# Patient Record
Sex: Male | Born: 1965 | Race: White | Hispanic: No | Marital: Married | Smoking: Current some day smoker
Health system: Southern US, Community
[De-identification: ages and names within clinical notes are randomized; demographics above are authoritative.]

## PROBLEM LIST (undated history)

## (undated) DIAGNOSIS — F32A Depression, unspecified: Secondary | ICD-10-CM

## (undated) DIAGNOSIS — F329 Major depressive disorder, single episode, unspecified: Secondary | ICD-10-CM

## (undated) DIAGNOSIS — E785 Hyperlipidemia, unspecified: Secondary | ICD-10-CM

## (undated) DIAGNOSIS — R7303 Prediabetes: Secondary | ICD-10-CM

## (undated) DIAGNOSIS — I2510 Atherosclerotic heart disease of native coronary artery without angina pectoris: Secondary | ICD-10-CM

## (undated) HISTORY — PX: BACK SURGERY: SHX140

## (undated) HISTORY — PX: NECK SURGERY: SHX720

## (undated) HISTORY — DX: Depression, unspecified: F32.A

---
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
## 2001-02-19 ENCOUNTER — Inpatient Hospital Stay (HOSPITAL_COMMUNITY): Admission: RE | Admit: 2001-02-19 | Discharge: 2001-02-20 | Payer: Self-pay | Admitting: Neurosurgery

## 2001-02-19 ENCOUNTER — Encounter: Payer: Self-pay | Admitting: Neurosurgery

## 2001-03-08 ENCOUNTER — Encounter: Admission: RE | Admit: 2001-03-08 | Discharge: 2001-03-08 | Payer: Self-pay | Admitting: Neurosurgery

## 2001-03-08 ENCOUNTER — Encounter: Payer: Self-pay | Admitting: Neurosurgery

## 2005-01-07 ENCOUNTER — Emergency Department (HOSPITAL_COMMUNITY): Admission: EM | Admit: 2005-01-07 | Discharge: 2005-01-07 | Payer: Self-pay | Admitting: Emergency Medicine

## 2006-04-12 ENCOUNTER — Emergency Department (HOSPITAL_COMMUNITY): Admission: EM | Admit: 2006-04-12 | Discharge: 2006-04-12 | Payer: Self-pay | Admitting: Emergency Medicine

## 2007-12-16 ENCOUNTER — Ambulatory Visit (HOSPITAL_COMMUNITY): Admission: RE | Admit: 2007-12-16 | Discharge: 2007-12-17 | Payer: Self-pay | Admitting: Orthopedic Surgery

## 2011-03-11 NOTE — Op Note (Signed)
Rodney Rangel, Rodney Rangel              ACCOUNT NO.:  [id number]   MEDICAL RECORD NO.:  [id number]          PATIENT TYPE:  AMB   LOCATION:  DAY                          FACILITY:  [facility]   PHYSICIAN:  Ronald A. Gioffre, M.D.DATE OF BIRTH:  30-Nov-1965   DATE OF PROCEDURE:  12/16/2007  DATE OF DISCHARGE:                               OPERATIVE REPORT   SURGEON:  Georges Lynch. Darrelyn Hillock, M.D.   ASSISTANT:  Marlowe Kays, M.D.   PREOPERATIVE DIAGNOSIS:  Extra large herniated lumbar disc at L5-S1 on  the left.  Note the patient had severe leg pain on the left, he  classified as an 11.   POSTOPERATIVE DIAGNOSIS:  Extra large herniated lumbar disc at L5-S1 on  the left.  Note the patient had severe leg pain on the left, he  classified as an 11.   OPERATION:  1. Hemilaminectomy at L5-S1, left.  2. Microdiscectomy at L5-S1, left.   PROCEDURE:  Under general anesthesia, the patient in a spinal frame,  routine orthopedic prep and drape of the lower back was carried out.  The patient had 1 gram of IV Ancef.  At this time, two needles were  placed in the back for localization purposes and an x-ray was taken.  Following that, an incision was made over the L5-S1 interspace.  Bleeders were identified and cauterized.  The self-retaining retractors  were inserted.  I separated the muscle first from the lamina and spinous  process in the usual fashion on the left.  I went down and put an  instrument in the space and took another x-ray to verify our position.  Following that, we did a hemilaminectomy in the usual fashion at L5-S1  on the left.  The microscope was brought in.  We carefully removed the  ligamentum flavum.  We decompressed the lateral recess because of the  nature of the disc that was so large.  We went down and first identified  the lateral recess veins and cauterized those with a bipolar.  Following  that, we noted the S1 root was extremely large and edematous.  We first  did a nice  foraminotomy to free up the root.  We gently retracted the  dura and went down and noted a large herniated lumbar disc under an  extreme amount of pressure.  We made a cruciate incision in the  posterior longitudinal ligament and the disc literally exploded out  through the posterior longitudinal ligament.  We then went down and  utilized a nerve hook and the Epstein curettes to clean out the rest of  the subligamentous disc material.  We went across the midline with the  upbiting pituitary rongeurs.  We then made sure the nerve was free and  the dura was free and it was. We made multiple passes into the disc  space, as well.  We were able to easily pass a hockey stick out the  foramina after the decompression and also we explored proximally and  medially as well and we now had complete freedom of the dura and the  root.  We thoroughly irrigated out the  area.  I loosely applied  some thrombin soaked Gelfoam and closed the wound in layers in the usual  fashion.  I left the small deep proximal part of the wound open for  drainage purposes.  The subcu was closed with 0 Vicryl and the skin with  metal staples and a sterile Neosporin dressing was applied.           ______________________________  Georges Lynch Darrelyn Hillock, M.D.     RAG/MEDQ  D:  12/16/2007  T:  12/17/2007  Job:  [id number]

## 2011-03-14 NOTE — H&P (Signed)
. [facility]  Patient:    Rodney Rangel, Rodney Rangel                     MRN: [id number] Adm. Date:  [id number] Disc. Date: [id number] Attending:  Danella Penton                         History and Physical  HISTORY OF PRESENT ILLNESS:  Mr. Siegmann is a gentleman who came to see me because neck and left upper extremity pain. This problem has been going on for more than 2 months, and he is not any better. He is having some numbness in the index and middle finger. The only way to get relief is to sleep in a recliner. He denies any problem with the right upper extremity or any problem with bladder or bowel.  PAST MEDICAL HISTORY:  Negative.  ALLERGIES:  CODEINE.  SOCIAL HISTORY:  He smokes a pack a day. He drinks socially.  FAMILY HISTORY:  Both parents are in good condition.  REVIEW OF SYSTEMS:  Only positive for arm pain, neck pain, and anxiety.  CURRENT MEDICATIONS:  He is taking Paxil and some pain medication.  PHYSICAL EXAMINATION:  GENERAL:  The patient came to my office. He was having quite a bit of difficulty with the left arm. He was holding the left arm against the chest wall to prevent any pain.  HEENT:  Normal.  NECK:  He is able to flex but ______ lateralization produces pain that goes to the left shoulder.  LUNGS:  Clear.  HEART:  Heart sounds normal.  ABDOMEN:  Normal.  EXTREMITIES:  Normal pulses.  NEUROLOGICAL:  Mental status normal. Cranial nerves normal. Strength is 5/5, except in the left tricep which is 1/5. There is a difference of an inch between the right and left the arm. He has a good thenar muscle but in the left hand that is weak in the hypothenar. Reflexes 2+ with absent in the left tricep.  LABORATORY DATA:  MRI showed that indeed he has a large herniated disk at the level of C6-C7, central to the left.  CLINICAL IMPRESSION:  C6-C7 herniated disk.  RECOMMENDATIONS:  The patient wants to proceed with  surgery. He knows all the risks, such as infection, CSF leak, worsening of pain, paralysis, and need for further surgery.DD:  02/19/01 TD:  02/21/01 Job: [id number]

## 2011-03-14 NOTE — Op Note (Signed)
[place]  Patient:    Rodney Rangel, Rodney Rangel                     MRN: [id number] Proc. Date: 02/19/01 Adm. Date:  [id number] Disc. Date: [id number] Attending:  Danella Penton                           Operative Report  PREOPERATIVE DIAGNOSIS:  C6-C7 herniated disk with chronic C7 radiculopathy.  POSTOPERATIVE DIAGNOSIS:  C3-C4 herniated disk with chronic C7 radiculopathy.  OPERATION:  Anterior C6-7 diskectomy, foraminotomy, decompression of the C6-7 nerve root, bone bank iliac crest and plate.  MICROSCOPE:  Midas Rex.  SURGEON:  Tanya Nones. Jeral Fruit, M.D.  ASSISTANTMena Goes. Franky Macho, M.D.  CLINICAL HISTORY: The patient is a 45 year old gentleman who had been complaining of neck and left upper extremity pain for several months.  Lately the pain went away, but by the time I saw him in by office, he had 0/5 at the triceps.  The patient did not want to go ahead with surgery.  He continued to work.  I saw him a few days ago in my office after he came in 72 hours later because the pain came back and was so intense that he wanted to have surgery as soon as possible.  This is the reason why I brought him in this evening to have surgery.  The patient knew of the risks such as explained in the History & Physical.  DESCRIPTION OF PROCEDURE:  The patient was taken to the OR, and the left side of the neck was prepped with Betadine.  Transverse incision through the skin and platysma down.  X-ray showed indeed we were at the level of C6-7.  He had anterior osteophyte which was removed.  We opened the anterior ligament, and we did a total gross diskectomy.  We brought the microscope in to the area. Indeed, on the left side, he has a soft and hard herniated disk with several fragments.  Some of the fragments were completely attached to the C6-7 level. The compression was achieved using the Kerrison punch as well as the drill. At the end, we had good  decompression of the C-7 nerve root bilaterally.  Then a piece of bone graft several millimeters high was inserted at this level followed by a plate with four screws.  X-ray of C spine showed good position of the graft.  From there on, the area was irrigated.  Hemostasis was done with bipolar, and the wound was closed with Vicryl and Steri-Strips.  The patient did well. DD:  02/19/01 TD:  02/21/01 Job: [id number]

## 2011-07-18 LAB — DIFFERENTIAL
Eosinophils Relative: 2
Lymphocytes Relative: 42
Lymphs Abs: 3.2
Monocytes Absolute: 0.4

## 2011-07-18 LAB — COMPREHENSIVE METABOLIC PANEL
AST: 12
Albumin: 3.5
Calcium: 9.3
Chloride: 107
Creatinine, Ser: 0.88
GFR calc Af Amer: >60
Total Bilirubin: 0.5

## 2011-07-18 LAB — URINALYSIS, ROUTINE W REFLEX MICROSCOPIC
Hgb urine dipstick: NEGATIVE
Nitrite: NEGATIVE
Protein, ur: NEGATIVE
Urobilinogen, UA: 1

## 2011-07-18 LAB — URINE CULTURE
Colony Count: NO GROWTH
Culture: NO GROWTH

## 2011-07-18 LAB — CBC
MCV: 90.2
Platelets: 212
WBC: 7.7

## 2015-06-18 ENCOUNTER — Emergency Department (HOSPITAL_COMMUNITY): Payer: No Typology Code available for payment source

## 2015-06-18 ENCOUNTER — Emergency Department (HOSPITAL_COMMUNITY)
Admission: EM | Admit: 2015-06-18 | Discharge: 2015-06-18 | Disposition: A | Discharge status: Home / Self Care | Payer: No Typology Code available for payment source | Attending: Physician Assistant | Admitting: Physician Assistant

## 2015-06-18 ENCOUNTER — Encounter (HOSPITAL_COMMUNITY): Payer: Self-pay | Admitting: Emergency Medicine

## 2015-06-18 DIAGNOSIS — Y998 Other external cause status: Secondary | ICD-10-CM | POA: Insufficient documentation

## 2015-06-18 DIAGNOSIS — Y9389 Activity, other specified: Secondary | ICD-10-CM | POA: Insufficient documentation

## 2015-06-18 DIAGNOSIS — S3992XA Unspecified injury of lower back, initial encounter: Secondary | ICD-10-CM | POA: Insufficient documentation

## 2015-06-18 DIAGNOSIS — Y92410 Unspecified street and highway as the place of occurrence of the external cause: Secondary | ICD-10-CM | POA: Diagnosis not present

## 2015-06-18 DIAGNOSIS — Z9889 Other specified postprocedural states: Secondary | ICD-10-CM | POA: Diagnosis not present

## 2015-06-18 DIAGNOSIS — Z720 Tobacco use: Secondary | ICD-10-CM | POA: Diagnosis not present

## 2015-06-18 DIAGNOSIS — M549 Dorsalgia, unspecified: Secondary | ICD-10-CM

## 2015-06-18 MED ORDER — OXYCODONE-ACETAMINOPHEN 5-325 MG PO TABS
2.0000 | ORAL_TABLET | Freq: Once | ORAL | Status: AC
  Administered 2015-06-18: 2 via ORAL
  Filled 2015-06-18: qty 2

## 2015-06-18 MED ORDER — OXYCODONE-ACETAMINOPHEN 5-325 MG PO TABS: 1.0000 | ORAL_TABLET | ORAL | Status: DC | PRN

## 2015-06-18 MED ORDER — METHOCARBAMOL 500 MG PO TABS: 500.0000 mg | ORAL_TABLET | Freq: Two times a day (BID) | ORAL | Status: DC

## 2015-06-18 NOTE — ED Notes (Signed)
Pt is a&ox4, and ambulatory. Questions denied r/t dc
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
## 2015-06-18 NOTE — Discharge Instructions (Signed)
Take the prescribed medication as directed to help with pain. Follow-up with your primary care physician. Return to the ED for new or worsening symptoms.
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
## 2016-11-05 DIAGNOSIS — Z720 Tobacco use: Secondary | ICD-10-CM | POA: Diagnosis not present

## 2016-11-14 DIAGNOSIS — E785 Hyperlipidemia, unspecified: Secondary | ICD-10-CM | POA: Diagnosis not present

## 2016-11-14 DIAGNOSIS — R7301 Impaired fasting glucose: Secondary | ICD-10-CM | POA: Diagnosis not present

## 2016-12-31 DIAGNOSIS — E785 Hyperlipidemia, unspecified: Secondary | ICD-10-CM | POA: Diagnosis not present

## 2017-05-26 DIAGNOSIS — L299 Pruritus, unspecified: Secondary | ICD-10-CM | POA: Diagnosis not present

## 2017-06-09 DIAGNOSIS — E785 Hyperlipidemia, unspecified: Secondary | ICD-10-CM | POA: Diagnosis not present

## 2017-10-27 HISTORY — PX: COLONOSCOPY: SHX174

## 2017-11-17 DIAGNOSIS — R7303 Prediabetes: Secondary | ICD-10-CM | POA: Diagnosis not present

## 2017-11-17 DIAGNOSIS — E785 Hyperlipidemia, unspecified: Secondary | ICD-10-CM | POA: Diagnosis not present

## 2018-01-06 ENCOUNTER — Emergency Department (HOSPITAL_COMMUNITY): Payer: 59

## 2018-01-06 ENCOUNTER — Other Ambulatory Visit: Payer: Self-pay

## 2018-01-06 ENCOUNTER — Encounter (HOSPITAL_COMMUNITY): Payer: Self-pay | Admitting: Emergency Medicine

## 2018-01-06 DIAGNOSIS — E669 Obesity, unspecified: Secondary | ICD-10-CM | POA: Diagnosis not present

## 2018-01-06 DIAGNOSIS — R7303 Prediabetes: Secondary | ICD-10-CM | POA: Diagnosis present

## 2018-01-06 DIAGNOSIS — Z8249 Family history of ischemic heart disease and other diseases of the circulatory system: Secondary | ICD-10-CM

## 2018-01-06 DIAGNOSIS — I25110 Atherosclerotic heart disease of native coronary artery with unstable angina pectoris: Secondary | ICD-10-CM | POA: Diagnosis not present

## 2018-01-06 DIAGNOSIS — R001 Bradycardia, unspecified: Secondary | ICD-10-CM | POA: Diagnosis not present

## 2018-01-06 DIAGNOSIS — F17290 Nicotine dependence, other tobacco product, uncomplicated: Secondary | ICD-10-CM | POA: Diagnosis present

## 2018-01-06 DIAGNOSIS — E785 Hyperlipidemia, unspecified: Secondary | ICD-10-CM | POA: Diagnosis present

## 2018-01-06 DIAGNOSIS — R079 Chest pain, unspecified: Secondary | ICD-10-CM | POA: Diagnosis not present

## 2018-01-06 DIAGNOSIS — Z6830 Body mass index (BMI) 30.0-30.9, adult: Secondary | ICD-10-CM

## 2018-01-06 DIAGNOSIS — R072 Precordial pain: Secondary | ICD-10-CM | POA: Diagnosis not present

## 2018-01-06 LAB — BASIC METABOLIC PANEL
ANION GAP: 11 (ref 5–15)
BUN: 16 mg/dL (ref 6–20)
CHLORIDE: 105 mmol/L (ref 101–111)
CO2: 26 mmol/L (ref 22–32)
Calcium: 9.6 mg/dL (ref 8.9–10.3)
Creatinine, Ser: 0.96 mg/dL (ref 0.61–1.24)
GFR calc Af Amer: >60 mL/min (ref >60)
GFR calc non Af Amer: >60 mL/min (ref >60)
GLUCOSE: 107 mg/dL — AB (ref 65–99)
POTASSIUM: 4.3 mmol/L (ref 3.5–5.1)
Sodium: 142 mmol/L (ref 135–145)

## 2018-01-06 LAB — CBC
HEMATOCRIT: 46.2 % (ref 39.0–52.0)
HEMOGLOBIN: 14.9 g/dL (ref 13.0–17.0)
MCH: 30.3 pg (ref 26.0–34.0)
MCHC: 32.3 g/dL (ref 30.0–36.0)
MCV: 94.1 fL (ref 78.0–100.0)
Platelets: 219 10*3/uL (ref 150–400)
RBC: 4.91 MIL/uL (ref 4.22–5.81)
RDW: 13.3 % (ref 11.5–15.5)
WBC: 9.2 10*3/uL (ref 4.0–10.5)

## 2018-01-06 LAB — I-STAT TROPONIN, ED: Troponin i, poc: 0 ng/mL (ref 0.00–0.08)

## 2018-01-06 NOTE — ED Triage Notes (Signed)
Pt complaint of mid chest pain about 2 hours ago; "white as a ghost" at time of event. Denies other.

## 2018-01-07 ENCOUNTER — Encounter (HOSPITAL_COMMUNITY)
Admission: EM | Disposition: A | Discharge status: Home / Self Care | Payer: Self-pay | Source: Home / Self Care | Attending: Emergency Medicine

## 2018-01-07 ENCOUNTER — Other Ambulatory Visit: Payer: Self-pay

## 2018-01-07 ENCOUNTER — Other Ambulatory Visit (HOSPITAL_COMMUNITY): Payer: 59

## 2018-01-07 ENCOUNTER — Observation Stay (HOSPITAL_COMMUNITY)
Admission: EM | Admit: 2018-01-07 | Discharge: 2018-01-08 | DRG: 247 | Disposition: A | Discharge status: Home / Self Care | Payer: 59 | Attending: Cardiology | Admitting: Cardiology

## 2018-01-07 ENCOUNTER — Encounter (HOSPITAL_COMMUNITY): Payer: Self-pay | Admitting: Internal Medicine

## 2018-01-07 DIAGNOSIS — R079 Chest pain, unspecified: Secondary | ICD-10-CM

## 2018-01-07 DIAGNOSIS — E669 Obesity, unspecified: Secondary | ICD-10-CM | POA: Diagnosis not present

## 2018-01-07 DIAGNOSIS — Z955 Presence of coronary angioplasty implant and graft: Secondary | ICD-10-CM

## 2018-01-07 DIAGNOSIS — R001 Bradycardia, unspecified: Secondary | ICD-10-CM | POA: Diagnosis not present

## 2018-01-07 DIAGNOSIS — I200 Unstable angina: Secondary | ICD-10-CM | POA: Diagnosis not present

## 2018-01-07 DIAGNOSIS — Z6830 Body mass index (BMI) 30.0-30.9, adult: Secondary | ICD-10-CM | POA: Diagnosis not present

## 2018-01-07 DIAGNOSIS — I25110 Atherosclerotic heart disease of native coronary artery with unstable angina pectoris: Secondary | ICD-10-CM

## 2018-01-07 DIAGNOSIS — E785 Hyperlipidemia, unspecified: Secondary | ICD-10-CM | POA: Diagnosis not present

## 2018-01-07 DIAGNOSIS — F17290 Nicotine dependence, other tobacco product, uncomplicated: Secondary | ICD-10-CM | POA: Diagnosis not present

## 2018-01-07 DIAGNOSIS — Z8249 Family history of ischemic heart disease and other diseases of the circulatory system: Secondary | ICD-10-CM | POA: Diagnosis not present

## 2018-01-07 DIAGNOSIS — R7303 Prediabetes: Secondary | ICD-10-CM | POA: Diagnosis not present

## 2018-01-07 DIAGNOSIS — R072 Precordial pain: Secondary | ICD-10-CM | POA: Diagnosis not present

## 2018-01-07 HISTORY — DX: Hyperlipidemia, unspecified: E78.5

## 2018-01-07 HISTORY — PX: LEFT HEART CATH AND CORONARY ANGIOGRAPHY: CATH118249

## 2018-01-07 HISTORY — PX: CORONARY STENT INTERVENTION: CATH118234

## 2018-01-07 HISTORY — DX: Prediabetes: R73.03

## 2018-01-07 HISTORY — PX: CARDIAC CATHETERIZATION: SHX172

## 2018-01-07 HISTORY — PX: CORONARY STENT PLACEMENT: SHX1402

## 2018-01-07 HISTORY — DX: Atherosclerotic heart disease of native coronary artery without angina pectoris: I25.10

## 2018-01-07 LAB — BRAIN NATRIURETIC PEPTIDE: B Natriuretic Peptide: 10.4 pg/mL (ref 0.0–100.0)

## 2018-01-07 LAB — LIPID PANEL
CHOLESTEROL: 148 mg/dL (ref 0–200)
HDL: 30 mg/dL — ABNORMAL LOW (ref >40)
LDL Cholesterol: 86 mg/dL (ref 0–99)
Total CHOL/HDL Ratio: 4.9 RATIO
Triglycerides: 162 mg/dL — ABNORMAL HIGH (ref <150)
VLDL: 32 mg/dL (ref 0–40)

## 2018-01-07 LAB — HEPATIC FUNCTION PANEL
ALK PHOS: 97 U/L (ref 38–126)
ALT: 22 U/L (ref 17–63)
AST: 19 U/L (ref 15–41)
Albumin: 3.8 g/dL (ref 3.5–5.0)
BILIRUBIN DIRECT: 0.1 mg/dL (ref 0.1–0.5)
BILIRUBIN TOTAL: 0.3 mg/dL (ref 0.3–1.2)
Indirect Bilirubin: 0.2 mg/dL — ABNORMAL LOW (ref 0.3–0.9)
Total Protein: 6.5 g/dL (ref 6.5–8.1)

## 2018-01-07 LAB — CREATININE, SERUM: CREATININE: 1.07 mg/dL (ref 0.61–1.24)

## 2018-01-07 LAB — PROTIME-INR
INR: 1.05
Prothrombin Time: 13.6 seconds (ref 11.4–15.2)

## 2018-01-07 LAB — HIV ANTIBODY (ROUTINE TESTING W REFLEX): HIV Screen 4th Generation wRfx: NONREACTIVE

## 2018-01-07 LAB — CBC
HCT: 42.2 % (ref 39.0–52.0)
HEMOGLOBIN: 13.7 g/dL (ref 13.0–17.0)
MCH: 30.8 pg (ref 26.0–34.0)
MCHC: 32.5 g/dL (ref 30.0–36.0)
MCV: 94.8 fL (ref 78.0–100.0)
Platelets: 198 10*3/uL (ref 150–400)
RBC: 4.45 MIL/uL (ref 4.22–5.81)
RDW: 13.7 % (ref 11.5–15.5)
WBC: 9.1 10*3/uL (ref 4.0–10.5)

## 2018-01-07 LAB — HEMOGLOBIN A1C
HEMOGLOBIN A1C: 6.2 % — AB (ref 4.8–5.6)
MEAN PLASMA GLUCOSE: 131.24 mg/dL

## 2018-01-07 LAB — POCT ACTIVATED CLOTTING TIME: Activated Clotting Time: 285 seconds

## 2018-01-07 LAB — TROPONIN I

## 2018-01-07 LAB — D-DIMER, QUANTITATIVE: D-Dimer, Quant: 0.27 ug/mL-FEU (ref 0.00–0.50)

## 2018-01-07 SURGERY — LEFT HEART CATH AND CORONARY ANGIOGRAPHY
Anesthesia: LOCAL

## 2018-01-07 MED ORDER — ROSUVASTATIN CALCIUM 20 MG PO TABS
40.0000 mg | ORAL_TABLET | Freq: Every day | ORAL | Status: DC
  Administered 2018-01-07 – 2018-01-08 (×2): 40 mg via ORAL
  Filled 2018-01-07 (×2): qty 2
  Filled 2018-01-07: qty 1

## 2018-01-07 MED ORDER — ONDANSETRON HCL 4 MG/2ML IJ SOLN
4.0000 mg | Freq: Four times a day (QID) | INTRAMUSCULAR | Status: DC | PRN
  Administered 2018-01-07: 17:00:00 4 mg via INTRAVENOUS
  Filled 2018-01-07: qty 2

## 2018-01-07 MED ORDER — MIDAZOLAM HCL 2 MG/2ML IJ SOLN
INTRAMUSCULAR | Status: DC | PRN
  Administered 2018-01-07: 2 mg via INTRAVENOUS

## 2018-01-07 MED ORDER — FENTANYL CITRATE (PF) 100 MCG/2ML IJ SOLN
INTRAMUSCULAR | Status: AC
  Filled 2018-01-07: qty 2

## 2018-01-07 MED ORDER — LIDOCAINE HCL (PF) 1 % IJ SOLN
INTRAMUSCULAR | Status: AC
  Filled 2018-01-07: qty 30

## 2018-01-07 MED ORDER — TICAGRELOR 90 MG PO TABS
90.0000 mg | ORAL_TABLET | Freq: Two times a day (BID) | ORAL | Status: DC
  Administered 2018-01-08 (×2): 90 mg via ORAL
  Filled 2018-01-07 (×2): qty 1

## 2018-01-07 MED ORDER — HYDROCORTISONE 1 % EX CREA
TOPICAL_CREAM | Freq: Every day | CUTANEOUS | Status: DC | PRN
  Administered 2018-01-07: 22:00:00 via TOPICAL
  Filled 2018-01-07: qty 28

## 2018-01-07 MED ORDER — ASPIRIN EC 325 MG PO TBEC
325.0000 mg | DELAYED_RELEASE_TABLET | Freq: Every day | ORAL | Status: DC
  Filled 2018-01-07: qty 1

## 2018-01-07 MED ORDER — NITROGLYCERIN 1 MG/10 ML FOR IR/CATH LAB
INTRA_ARTERIAL | Status: DC | PRN
  Administered 2018-01-07: 200 ug via INTRACORONARY

## 2018-01-07 MED ORDER — SODIUM CHLORIDE 0.9 % WEIGHT BASED INFUSION
1.0000 mL/kg/h | INTRAVENOUS | Status: AC
  Administered 2018-01-07: 20:00:00 1 mL/kg/h via INTRAVENOUS

## 2018-01-07 MED ORDER — LIDOCAINE HCL (PF) 1 % IJ SOLN
INTRAMUSCULAR | Status: DC | PRN
  Administered 2018-01-07: 2 mL

## 2018-01-07 MED ORDER — ACETAMINOPHEN 325 MG PO TABS: 650.0000 mg | ORAL_TABLET | ORAL | Status: DC | PRN

## 2018-01-07 MED ORDER — HEPARIN SODIUM (PORCINE) 1000 UNIT/ML IJ SOLN
INTRAMUSCULAR | Status: AC
  Filled 2018-01-07: qty 1

## 2018-01-07 MED ORDER — HEPARIN (PORCINE) IN NACL 2-0.9 UNIT/ML-% IJ SOLN
INTRAMUSCULAR | Status: AC | PRN
  Administered 2018-01-07: 500 mL

## 2018-01-07 MED ORDER — VERAPAMIL HCL 2.5 MG/ML IV SOLN
INTRAVENOUS | Status: AC
  Filled 2018-01-07: qty 2

## 2018-01-07 MED ORDER — VERAPAMIL HCL 2.5 MG/ML IV SOLN
INTRAVENOUS | Status: DC | PRN
  Administered 2018-01-07: 14:00:00 via INTRA_ARTERIAL

## 2018-01-07 MED ORDER — HEART ATTACK BOUNCING BOOK
Freq: Once | Status: AC
  Administered 2018-01-08: 02:00:00
  Filled 2018-01-07: qty 1

## 2018-01-07 MED ORDER — MIDAZOLAM HCL 2 MG/2ML IJ SOLN
INTRAMUSCULAR | Status: AC
  Filled 2018-01-07: qty 2

## 2018-01-07 MED ORDER — ASPIRIN 81 MG PO CHEW
324.0000 mg | CHEWABLE_TABLET | Freq: Once | ORAL | Status: AC
  Administered 2018-01-07: 324 mg via ORAL
  Filled 2018-01-07: qty 4

## 2018-01-07 MED ORDER — IOPAMIDOL (ISOVUE-370) INJECTION 76%
INTRAVENOUS | Status: DC | PRN
  Administered 2018-01-07: 130 mL via INTRA_ARTERIAL

## 2018-01-07 MED ORDER — SODIUM CHLORIDE 0.9% FLUSH
3.0000 mL | Freq: Two times a day (BID) | INTRAVENOUS | Status: DC
  Administered 2018-01-08: 3 mL via INTRAVENOUS

## 2018-01-07 MED ORDER — SODIUM CHLORIDE 0.9% FLUSH: 3.0000 mL | INTRAVENOUS | Status: DC | PRN

## 2018-01-07 MED ORDER — ANGIOPLASTY BOOK
Freq: Once | Status: AC
  Administered 2018-01-08: 02:00:00
  Filled 2018-01-07: qty 1

## 2018-01-07 MED ORDER — TICAGRELOR 90 MG PO TABS
ORAL_TABLET | ORAL | Status: DC | PRN
  Administered 2018-01-07: 180 mg via ORAL

## 2018-01-07 MED ORDER — ASPIRIN 81 MG PO CHEW
81.0000 mg | CHEWABLE_TABLET | Freq: Every day | ORAL | Status: DC
  Administered 2018-01-08: 81 mg via ORAL
  Filled 2018-01-07: qty 1

## 2018-01-07 MED ORDER — IOPAMIDOL (ISOVUE-370) INJECTION 76%
INTRAVENOUS | Status: AC
  Filled 2018-01-07: qty 50

## 2018-01-07 MED ORDER — HEPARIN SODIUM (PORCINE) 1000 UNIT/ML IJ SOLN
INTRAMUSCULAR | Status: DC | PRN
  Administered 2018-01-07 (×2): 5000 [IU] via INTRAVENOUS

## 2018-01-07 MED ORDER — FENTANYL CITRATE (PF) 100 MCG/2ML IJ SOLN
INTRAMUSCULAR | Status: DC | PRN
  Administered 2018-01-07: 25 ug via INTRAVENOUS

## 2018-01-07 MED ORDER — ENOXAPARIN SODIUM 40 MG/0.4ML ~~LOC~~ SOLN
40.0000 mg | SUBCUTANEOUS | Status: DC
  Filled 2018-01-07: qty 0.4

## 2018-01-07 MED ORDER — PAROXETINE HCL 20 MG PO TABS
40.0000 mg | ORAL_TABLET | Freq: Every day | ORAL | Status: DC
  Administered 2018-01-07 – 2018-01-08 (×2): 40 mg via ORAL
  Filled 2018-01-07 (×3): qty 2

## 2018-01-07 MED ORDER — TICAGRELOR 90 MG PO TABS
ORAL_TABLET | ORAL | Status: AC
  Filled 2018-01-07: qty 2

## 2018-01-07 MED ORDER — HEPARIN (PORCINE) IN NACL 2-0.9 UNIT/ML-% IJ SOLN
INTRAMUSCULAR | Status: AC
  Filled 2018-01-07: qty 1000

## 2018-01-07 MED ORDER — SODIUM CHLORIDE 0.9 % IV SOLN: 250.0000 mL | INTRAVENOUS | Status: DC | PRN

## 2018-01-07 MED ORDER — IOPAMIDOL (ISOVUE-370) INJECTION 76%
INTRAVENOUS | Status: AC
  Filled 2018-01-07: qty 100

## 2018-01-07 MED ORDER — GABAPENTIN 400 MG PO CAPS
800.0000 mg | ORAL_CAPSULE | Freq: Every day | ORAL | Status: DC
  Administered 2018-01-07 – 2018-01-08 (×2): 800 mg via ORAL
  Filled 2018-01-07 (×2): qty 2

## 2018-01-07 SURGICAL SUPPLY — 20 items
BALLN SAPPHIRE 2.5X12 (BALLOONS) ×2
BALLN SAPPHIRE ~~LOC~~ 4.0X15 (BALLOONS) ×2 IMPLANT
BALLOON SAPPHIRE 2.5X12 (BALLOONS) ×1 IMPLANT
BAND ZEPHYR COMPRESS 30 LONG (HEMOSTASIS) ×2 IMPLANT
CATH INFINITI 5 FR JL3.5 (CATHETERS) ×2 IMPLANT
CATH INFINITI 5FR ANG PIGTAIL (CATHETERS) ×2 IMPLANT
CATH INFINITI JR4 5F (CATHETERS) ×2 IMPLANT
CATH VISTA GUIDE 6FR MPA1 (CATHETERS) ×2 IMPLANT
GUIDEWIRE INQWIRE 1.5J.035X260 (WIRE) ×1 IMPLANT
INQWIRE 1.5J .035X260CM (WIRE) ×2
KIT ENCORE 26 ADVANTAGE (KITS) ×2 IMPLANT
KIT HEART LEFT (KITS) ×2 IMPLANT
NEEDLE PERC 21GX4CM (NEEDLE) ×2 IMPLANT
PACK CARDIAC CATHETERIZATION (CUSTOM PROCEDURE TRAY) ×2 IMPLANT
SHEATH RAIN RADIAL 21G 6FR (SHEATH) ×2 IMPLANT
STENT SYNERGY DES 3.5X16 (Permanent Stent) ×2 IMPLANT
STENT SYNERGY DES 3.5X8 (Permanent Stent) ×2 IMPLANT
TRANSDUCER W/STOPCOCK (MISCELLANEOUS) ×2 IMPLANT
TUBING CIL FLEX 10 FLL-RA (TUBING) ×2 IMPLANT
WIRE ASAHI PROWATER 180CM (WIRE) ×2 IMPLANT

## 2018-01-07 NOTE — ED Notes (Signed)
[facility] has arrived for pt transport to Cath Lab
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
## 2018-01-07 NOTE — H&P (Signed)
History and Physical    Rodney Rangel UJW:[id number] DOB: 1966-06-15 DOA: 01/07/2018  PCP: Deatra James, MD  Patient coming from: Home.  Chief Complaint: Chest pain.  HPI: Rodney Rangel is a 52 y.o. male with history of tobacco abuse and hyperlipidemia presents to the ER because of chest pain.  Patient states last afternoon around 2 PM while walking patient started developing squeezing type of chest pressure with some shortness of breath and diaphoresis.  Increase on exertion.  Since the discomfort is not getting better patient talked to the nurse at the facility.  Eventually patient came to the ER with his family.  By the time patient reached ER chest pain is resolved.  Patient states he has been having chest pain off and on for last 5 weeks.  But has not been so intense.  Denies any nausea vomiting abdominal pain fever chills or productive cough.  ED Course: In the ER EKG shows nonspecific changes in the inferior leads which changed on repeat EKG.  Troponin was negative.  Chest x-ray unremarkable.  Given the exertional symptoms and family history of CAD in his dad and patient's history of tobacco abuse patient admitted for further management of ACS rule out.  Review of Systems: As per HPI, rest all negative.   Past Medical History:  Diagnosis Date  . Hyperlipidemia     Past Surgical History:  Procedure Laterality Date  . BACK SURGERY    . NECK SURGERY       reports that he has been smoking.  he has never used smokeless tobacco. He reports that he does not drink alcohol. His drug history is not on file.  No Known Allergies  Family History - Father had CAD and Lung cancer.  Prior to Admission medications   Medication Sig Start Date End Date Taking? Authorizing Provider  gabapentin (NEURONTIN) 800 MG tablet Take 800 mg by mouth daily. 11/20/17  Yes [provider]  PARoxetine (PAXIL) 40 MG tablet Take 40 mg by mouth daily. 11/20/17  Yes [provider]    rosuvastatin (CRESTOR) 40 MG tablet Take 40 mg by mouth daily.   Yes [provider]  methocarbamol (ROBAXIN) 500 MG tablet Take 1 tablet (500 mg total) by mouth 2 (two) times daily. Patient not taking: Reported on 01/07/2018 06/18/15   Garlon Hatchet, PA-C  oxyCODONE-acetaminophen (PERCOCET/ROXICET) 5-325 MG per tablet Take 1 tablet by mouth every 4 (four) hours as needed. Patient not taking: Reported on 01/07/2018 06/18/15   Garlon Hatchet, PA-C    Physical Exam: Vitals:   01/06/18 2004 01/06/18 2221 01/07/18 0249 01/07/18 0500  BP: (!) 151/81 136/85 114/79 102/64  Pulse: 84 86 65 69  Resp: 16 16 16 16   Temp: 98.3 F (36.8 C) 98.2 F (36.8 C)    TempSrc: Oral Oral    SpO2: 98% 100% 96% 92%      Constitutional: Moderately built and nourished. Vitals:   01/06/18 2004 01/06/18 2221 01/07/18 0249 01/07/18 0500  BP: (!) 151/81 136/85 114/79 102/64  Pulse: 84 86 65 69  Resp: 16 16 16 16   Temp: 98.3 F (36.8 C) 98.2 F (36.8 C)    TempSrc: Oral Oral    SpO2: 98% 100% 96% 92%   Eyes: Anicteric no pallor. ENMT: No discharge from the ears eyes nose or mouth. Neck: No mass felt.  No JVD appreciated. Respiratory: No rhonchi or crepitations. Cardiovascular: S1-S2 heard no murmurs appreciated. Abdomen: Soft nontender bowel sounds present. Musculoskeletal:  No edema.  No joint effusion. Skin: No rash.  Skin appears warm. Neurologic: Alert awake oriented to time place and person.  Moves all extremities. Psychiatric: Appears normal.  Normal affect.   Labs on Admission: I have personally reviewed following labs and imaging studies  CBC: Recent Labs  Lab 01/06/18 1727  WBC 9.2  HGB 14.9  HCT 46.2  MCV 94.1  PLT 219   Basic Metabolic Panel: Recent Labs  Lab 01/06/18 1727  NA 142  K 4.3  CL 105  CO2 26  GLUCOSE 107*  BUN 16  CREATININE 0.96  CALCIUM 9.6   GFR: CrCl cannot be calculated (Unknown ideal weight.). Liver Function Tests: No results for  input(s): AST, ALT, ALKPHOS, BILITOT, PROT, ALBUMIN in the last 168 hours. No results for input(s): LIPASE, AMYLASE in the last 168 hours. No results for input(s): AMMONIA in the last 168 hours. Coagulation Profile: No results for input(s): INR, PROTIME in the last 168 hours. Cardiac Enzymes: Recent Labs  Lab 01/07/18 0214  TROPONINI <0.03   BNP (last 3 results) No results for input(s): PROBNP in the last 8760 hours. HbA1C: No results for input(s): HGBA1C in the last 72 hours. CBG: No results for input(s): GLUCAP in the last 168 hours. Lipid Profile: No results for input(s): CHOL, HDL, LDLCALC, TRIG, CHOLHDL, LDLDIRECT in the last 72 hours. Thyroid Function Tests: No results for input(s): TSH, T4TOTAL, FREET4, T3FREE, THYROIDAB in the last 72 hours. Anemia Panel: No results for input(s): VITAMINB12, FOLATE, FERRITIN, TIBC, IRON, RETICCTPCT in the last 72 hours. Urine analysis:    Component Value Date/Time   COLORURINE AMBER BIOCHEMICALS MAY BE AFFECTED BY COLOR (A) 12/10/2007 1404   APPEARANCEUR CLEAR 12/10/2007 1404   LABSPEC 1.038 (H) 12/10/2007 1404   PHURINE 5.0 12/10/2007 1404   GLUCOSEU NEGATIVE 12/10/2007 1404   HGBUR NEGATIVE 12/10/2007 1404   BILIRUBINUR SMALL (A) 12/10/2007 1404   KETONESUR NEGATIVE 12/10/2007 1404   PROTEINUR NEGATIVE 12/10/2007 1404   UROBILINOGEN 1.0 12/10/2007 1404   NITRITE NEGATIVE 12/10/2007 1404   LEUKOCYTESUR  12/10/2007 1404    NEGATIVE MICROSCOPIC NOT DONE ON URINES WITH NEGATIVE PROTEIN, BLOOD, LEUKOCYTES, NITRITE, OR GLUCOSE <1000 mg/dL.   Sepsis Labs: @LABRCNTIP (procalcitonin:4,lacticidven:4) )No results found for this or any previous visit (from the past 240 hour(s)).   Radiological Exams on Admission: Dg Chest 2 View  Result Date: 01/06/2018 CLINICAL DATA:  Dizziness, chest pain EXAM: CHEST - 2 VIEW COMPARISON:  12/10/2007 FINDINGS: Heart and mediastinal contours are within normal limits. No focal opacities or effusions. No  acute bony abnormality. IMPRESSION: No active cardiopulmonary disease. Electronically Signed   By: Charlett NoseKevin  Dover M.D.   On: 01/06/2018 18:21    EKG: Independently reviewed.  Normal sinus rhythm with inferior T wave changes which has changed in the retrograde EKG.  Assessment/Plan Principal Problem:   Chest pain Active Problems:   Hyperlipidemia    1. Chest pain -presently chest pain-free.  Given the history of tobacco abuse hyperlipidemia and family history we will cycle cardiac markers to rule out ACS.  Check 2D echo.  Check urine drug screen d-dimer and I have requested cardiology consult.  Aspirin. 2. Hyperlipidemia on statins. 3. History of tobacco abuse -advised to quit smoking.    DVT prophylaxis: Lovenox. Code Status: Full code. Family Communication: Patient's family at the bedside. Disposition Plan: Home. Consults called: Cardiology. Admission status: Observation.   Eduard ClosArshad N Yuvonne Lanahan MD [facility] Pager [phone].  If 7PM-7AM, please contact night-coverage [email or web address] Password  [email or web address]  01/07/2018, 5:33 AM

## 2018-01-07 NOTE — Interval H&P Note (Signed)
History and Physical Interval Note:  01/07/2018 2:14 PM  Rodney Rangel  has presented today for surgery, with the diagnosis of unstable angina  The various methods of treatment have been discussed with the patient and family. After consideration of risks, benefits and other options for treatment, the patient has consented to  Procedure(s): LEFT HEART CATH AND CORONARY ANGIOGRAPHY (N/A) as a surgical intervention .  The patient's history has been reviewed, patient examined, no change in status, stable for surgery.  I have reviewed the patient's chart and labs.  Questions were answered to the patient's satisfaction.   Cath Lab Visit (complete for each Cath Lab visit)  Clinical Evaluation Leading to the Procedure:   ACS: Yes.    Non-ACS:    Anginal Classification: CCS IV  Anti-ischemic medical therapy: No Therapy  Non-Invasive Test Results: No non-invasive testing performed  Prior CABG: No previous CABG        Theron Aristaeter [facility] CenterJordanMD,FACC 01/07/2018 2:14 PM

## 2018-01-07 NOTE — ED Provider Notes (Signed)
[facility]-EMERGENCY DEPT Provider Note   CSN: [id number] Arrival date & time: 01/06/18  1621     History   Chief Complaint Chief Complaint  Patient presents with  . Chest Pain    Rodney Rangel is a 52 y.o. male.  Rodney   52 yo M with PMHx HLD, obesity, tobacco use, here with CP. Pt was at work when he developed a dull, aching, substernal chest pain. Pt states he's had some mild pain like this with exertion over 2-3 weeks, but today was the worst. It occurred as he was exerting himself. He had diaphoresis, severe SOB with it, and nausea. It resolved with rest. He called EMS and was brought here. No cough. No sputum production. Has not ever had a cardiac work-up. Relieved by rest consistently, but is increasingly severe.  History reviewed. No pertinent past medical history.  There are no active problems to display for this patient.   Past Surgical History:  Procedure Laterality Date  . BACK SURGERY    . NECK SURGERY         Home Medications    Prior to Admission medications   Medication Sig Start Date End Date Taking? Authorizing Provider  methocarbamol (ROBAXIN) 500 MG tablet Take 1 tablet (500 mg total) by mouth 2 (two) times daily. 06/18/15   Garlon Hatchet, PA-C  oxyCODONE-acetaminophen (PERCOCET/ROXICET) 5-325 MG per tablet Take 1 tablet by mouth every 4 (four) hours as needed. 06/18/15   Garlon Hatchet, PA-C    Family History No family history on file.  Social History Social History   Tobacco Use  . Smoking status: Current Every Day Smoker  Substance Use Topics  . Alcohol use: No  . Drug use: Not on file     Allergies   Patient has no known allergies.   Review of Systems Review of Systems  Constitutional: Positive for diaphoresis.  Respiratory: Positive for chest tightness and shortness of breath.   Cardiovascular: Positive for chest pain.  Neurological: Positive for weakness.  All other systems reviewed and are  negative.    Physical Exam Updated Vital Signs BP 136/85 (BP Location: Right Arm)   Pulse 86   Temp 98.2 F (36.8 C) (Oral)   Resp 16   SpO2 100%   Physical Exam  Constitutional: He is oriented to person, place, and time. He appears well-developed and well-nourished. No distress.  HENT:  Head: Normocephalic and atraumatic.  Eyes: Conjunctivae are normal.  Neck: Neck supple.  Cardiovascular: Normal rate, regular rhythm and normal heart sounds. Exam reveals no friction rub.  No murmur heard. Pulmonary/Chest: Effort normal and breath sounds normal. No respiratory distress. He has no wheezes. He has no rales.  Abdominal: He exhibits no distension.  Musculoskeletal: He exhibits no edema.  Neurological: He is alert and oriented to person, place, and time. He exhibits normal muscle tone.  Skin: Skin is warm. Capillary refill takes less than 2 seconds.  Psychiatric: He has a normal mood and affect.  Nursing note and vitals reviewed.    ED Treatments / Results  Labs (all labs ordered are listed, but only abnormal results are displayed) Labs Reviewed  BASIC METABOLIC PANEL - Abnormal; Notable for the following components:      Result Value   Glucose, Bld 107 (*)    All other components within normal limits  CBC  TROPONIN I  BRAIN NATRIURETIC PEPTIDE  I-STAT TROPONIN, ED    EKG  EKG Interpretation  Date/Time:  Wednesday January 06 2018 17:07:49 EDT Ventricular Rate:  78 PR Interval:    QRS Duration: 84 QT Interval:  380 QTC Calculation: 433 R Axis:   97 Text Interpretation:  Sinus rhythm Left atrial enlargement inferior t wave changes increased since last tracing Confirmed by Linwood DibblesKnapp, Jon [phone]([id number]) on 01/06/2018 5:11:20 PM       Radiology Dg Chest 2 View  Result Date: 01/06/2018 CLINICAL DATA:  Dizziness, chest pain EXAM: CHEST - 2 VIEW COMPARISON:  12/10/2007 FINDINGS: Heart and mediastinal contours are within normal limits. No focal opacities or effusions. No acute bony  abnormality. IMPRESSION: No active cardiopulmonary disease. Electronically Signed   By: Charlett NoseKevin  Dover M.D.   On: 01/06/2018 18:21    Procedures Procedures (including critical care time)  Medications Ordered in ED Medications  aspirin chewable tablet 324 mg (not administered)     Initial Impression / Assessment and Plan / ED Course  I have reviewed the triage vital signs and the nursing notes.  Pertinent labs & imaging results that were available during my care of the patient were reviewed by me and considered in my medical decision making (see chart for details).     52 yo M with PMHx HLD, obesity, tobacco abuse, suspected HTN here with typical CP. HEART score 5. CP free now. Concern for angina, possibly unstable. Admit for high risk CP obs.  Final Clinical Impressions(s) / ED Diagnoses   Final diagnoses:  Chest pain with high risk for cardiac etiology    ED Discharge Orders    None       Shaune PollackIsaacs, Latonja Bobeck, MD 01/07/18 [phone]

## 2018-01-07 NOTE — H&P (View-Only) (Signed)
Cardiology Consultation:   Patient ID: Rodney Rangel; [id number]; 11/27/1965   Admit date: 01/07/2018 Date of Consult: 01/07/2018  Primary Care Provider: Sun, Vyvyan, MD Primary Cardiologist: James Hochrein, MD  Primary Electrophysiologist:     Patient Profile:   Rodney Rangel is a 52 y.o. male with a hx of smoking and HLD who is being seen today for the evaluation of chest pain at the request of Dr. Arrien.  History of Present Illness:   [name] does not follow with cardiology, but does regularly see his PCP. He presented to [facility] after sudden onset chest pain yesterday about 1400. 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 Yesterday at approximately 2pm after climbing stairs, he had sudden onset of substernal crushing chest pain rated 10/10. He became short of breath, nauseous, and diaphoretic. The onsite nurse and fire department were dispatched. The pain lasted about 30 min and subsided with rest in the air conditioning. His wife brought him to [facility]. On arrival, troponin remained negative and EKG with TWI in lead III that resolved on repeat EKG. He received ASA, no nitro, and has not had a recurrence in his chest pain since presented to the ED. He does report 2-3 month history of chest pain with activity rated as a 5/10 that always resolved with rest and was not associated with nausea or diaphoresis.   He is a current smoker who now vapes, but used to smoke 1 ppd for 30 years. He states that his PCP told him his last cholesterol test was high. He has also been told that he is pre-diabetic. He has a family history of heart disease in his father (unknown age of his first heart attack, but did have CABG in his 70s).  D-dimer negative and BNP WNL.   Past Medical History:  Diagnosis Date  . Hyperlipidemia     Past Surgical History:  Procedure Laterality Date    . BACK SURGERY    . NECK SURGERY       Home Medications:  Prior to Admission medications   Medication Sig Start Date End Date Taking? Authorizing Provider  gabapentin (NEURONTIN) 800 MG tablet Take 800 mg by mouth daily. 11/20/17  Yes [provider]  PARoxetine (PAXIL) 40 MG tablet Take 40 mg by mouth daily. 11/20/17  Yes [provider]  rosuvastatin (CRESTOR) 40 MG tablet Take 40 mg by mouth daily.   Yes [provider]  methocarbamol (ROBAXIN) 500 MG tablet Take 1 tablet (500 mg total) by mouth 2 (two) times daily. Patient not taking: Reported on 01/07/2018 06/18/15   Sanders, Lisa M, PA-C  oxyCODONE-acetaminophen (PERCOCET/ROXICET) 5-325 MG per tablet Take 1 tablet by mouth every 4 (four) hours as needed. Patient not taking: Reported on 01/07/2018 06/18/15   Sanders, Lisa M, PA-C    Inpatient Medications: Scheduled Meds: . aspirin EC  325 mg Oral Daily  . enoxaparin (LOVENOX) injection  40 mg Subcutaneous Q24H  . gabapentin  800 mg Oral Daily  . PARoxetine  40 mg Oral Daily  . rosuvastatin  40 mg Oral Daily   Continuous Infusions:  PRN Meds: acetaminophen, ondansetron (ZOFRAN) IV  Allergies:   No Known Allergies  Social History:   Social History   Socioeconomic History  . Marital status: Married    Spouse name: Not on file  . Number of children: Not on file  . Years of   education: Not on file  . Highest education level: Not on file  Social Needs  . Financial resource strain: Not on file  . Food insecurity - worry: Not on file  . Food insecurity - inability: Not on file  . Transportation needs - medical: Not on file  . Transportation needs - non-medical: Not on file  Occupational History  . Not on file  Tobacco Use  . Smoking status: Current Every Day Smoker  . Smokeless tobacco: Never Used  Substance and Sexual Activity  . Alcohol use: No  . Drug use: Not on file  . Sexual activity: Not on file  Other Topics Concern  . Not on file   Social History Narrative  . Not on file    Family History:    Family History  Problem Relation Age of Onset  . CAD Father   . Lung cancer Father      ROS:  Please see the history of present illness.   All other ROS reviewed and negative.     Physical Exam/Data:   Vitals:   01/06/18 2221 01/07/18 0249 01/07/18 0500 01/07/18 0715  BP: 136/85 114/79 102/64 131/76  Pulse: 86 65 69 70  Resp: 16 16 16 18  Temp: 98.2 F (36.8 C)     TempSrc: Oral     SpO2: 100% 96% 92% 92%   No intake or output data in the 24 hours ending 01/07/18 0752 There were no vitals filed for this visit. There is no height or weight on file to calculate BMI.  General:  Well nourished, well developed, in no acute distress HEENT: normal Neck: no JVD Vascular: No carotid bruits Cardiac:  normal S1, S2; RRR; no murmur Lungs:  clear to auscultation bilaterally, no wheezing, rhonchi or rales  Abd: soft, nontender, no hepatomegaly  Ext: no edema Musculoskeletal:  No deformities, BUE and BLE strength normal and equal Skin: warm and dry  Neuro:  CNs 2-12 intact, no focal abnormalities noted Psych:  Normal affect   EKG:  The EKG was personally reviewed and demonstrates:  Sinus, TWI in Lead III; repeat EKG with resolved TWI Telemetry:  Telemetry was personally reviewed and demonstrates:  sinus  Relevant CV Studies:  Echo 01/07/18: pending   Laboratory Data:  Chemistry Recent Labs  Lab 01/06/18 1727 01/07/18 0630  NA 142  --   K 4.3  --   CL 105  --   CO2 26  --   GLUCOSE 107*  --   BUN 16  --   CREATININE 0.96 1.07  CALCIUM 9.6  --   GFRNONAA >60 >60  GFRAA >60 >60  ANIONGAP 11  --     No results for input(s): PROT, ALBUMIN, AST, ALT, ALKPHOS, BILITOT in the last 168 hours. Hematology Recent Labs  Lab 01/06/18 1727 01/07/18 0630  WBC 9.2 9.1  RBC 4.91 4.45  HGB 14.9 13.7  HCT 46.2 42.2  MCV 94.1 94.8  MCH 30.3 30.8  MCHC 32.3 32.5  RDW 13.3 13.7  PLT 219 198   Cardiac  Enzymes Recent Labs  Lab 01/07/18 0214 01/07/18 0630  TROPONINI <0.03 <0.03    Recent Labs  Lab 01/06/18 1742  TROPIPOC 0.00    BNP Recent Labs  Lab 01/07/18 0214  BNP 10.4    DDimer  Recent Labs  Lab 01/07/18 0655  DDIMER 0.27    Radiology/Studies:  Dg Chest 2 View  Result Date: 01/06/2018 CLINICAL DATA:  Dizziness, chest pain EXAM: CHEST - 2   VIEW COMPARISON:  12/10/2007 FINDINGS: Heart and mediastinal contours are within normal limits. No focal opacities or effusions. No acute bony abnormality. IMPRESSION: No active cardiopulmonary disease. Electronically Signed   By: Kevin  Dover M.D.   On: 01/06/2018 18:21    Assessment and Plan:   1.  Chest pain Troponin x 3 negative EKG with nonspecific T wave changes. Pt has risk factors for ACS, including HLD, pre-diabetes, smoking, and family history. Echocardiogram pending. It sounds as though he has been having exertional chest pain relieved with rest over the last 2-3 months. This episode of chest pain is concerning for stable angina. I will consult with attending about heart catheterization this afternoon. He is NPO.   2. HLD Continue crestor 40 mg.  Lipid panel not in Epic. Will obtain lipid panel and LFT this admission.   3. Current smoker Counseled on smoking cessation.    4. Pre- diabetes Will check an A1c here.   For questions or updates, please contact [facility] Please consult [email or web address] for contact info under Cardiology/STEMI.   Signed, Angela Nicole Duke, PA  01/07/2018 7:52 AM  History and all data above reviewed.  Patient examined.  The patient presents with exertional chest pain as described above.  This has progressed and yesterday he had a severe episode with minimal exertion.  No acute EKG changes.  No enzymes elevations.   I agree with the findings as above.  The patient exam reveals COR:RRR  ,  Lungs:   Clear  ,  Abd: Positive bowel sounds, no rebound no guarding, Ext No edema  .  All  available labs, radiology testing, previous records reviewed. Agree with documented assessment and plan. Chest pain:  Unstable angina.  High pretest probability of obstructive CAD.  Needs cardiac cath.  The patient understands that risks included but are not limited to stroke (1 in 1000), death (1 in 1000), kidney failure [usually temporary] (1 in 500), bleeding (1 in 200), allergic reaction [possibly serious] (1 in 200).  The patient understands and agrees to proceed.   Tobacco abuse.    James Hochrein  11:35 AM  01/07/2018 

## 2018-01-08 ENCOUNTER — Telehealth: Payer: Self-pay | Admitting: Cardiology

## 2018-01-08 ENCOUNTER — Encounter (HOSPITAL_COMMUNITY): Payer: Self-pay | Admitting: Cardiology

## 2018-01-08 ENCOUNTER — Ambulatory Visit (HOSPITAL_COMMUNITY): Payer: 59

## 2018-01-08 DIAGNOSIS — I25110 Atherosclerotic heart disease of native coronary artery with unstable angina pectoris: Secondary | ICD-10-CM | POA: Diagnosis not present

## 2018-01-08 DIAGNOSIS — R7303 Prediabetes: Secondary | ICD-10-CM | POA: Diagnosis not present

## 2018-01-08 DIAGNOSIS — E785 Hyperlipidemia, unspecified: Secondary | ICD-10-CM | POA: Diagnosis not present

## 2018-01-08 DIAGNOSIS — R001 Bradycardia, unspecified: Secondary | ICD-10-CM | POA: Diagnosis not present

## 2018-01-08 DIAGNOSIS — I200 Unstable angina: Secondary | ICD-10-CM | POA: Diagnosis not present

## 2018-01-08 LAB — CBC
HCT: 38.9 % — ABNORMAL LOW (ref 39.0–52.0)
Hemoglobin: 12.2 g/dL — ABNORMAL LOW (ref 13.0–17.0)
MCH: 29.3 pg (ref 26.0–34.0)
MCHC: 31.4 g/dL (ref 30.0–36.0)
MCV: 93.3 fL (ref 78.0–100.0)
Platelets: 160 10*3/uL (ref 150–400)
RBC: 4.17 MIL/uL — ABNORMAL LOW (ref 4.22–5.81)
RDW: 13.4 % (ref 11.5–15.5)
WBC: 7.2 10*3/uL (ref 4.0–10.5)

## 2018-01-08 LAB — BASIC METABOLIC PANEL
ANION GAP: 8 (ref 5–15)
BUN: 18 mg/dL (ref 6–20)
CALCIUM: 8.5 mg/dL — AB (ref 8.9–10.3)
CO2: 24 mmol/L (ref 22–32)
Chloride: 107 mmol/L (ref 101–111)
Creatinine, Ser: 1.1 mg/dL (ref 0.61–1.24)
GFR calc Af Amer: >60 mL/min (ref >60)
GLUCOSE: 142 mg/dL — AB (ref 65–99)
Potassium: 4.1 mmol/L (ref 3.5–5.1)
SODIUM: 139 mmol/L (ref 135–145)

## 2018-01-08 MED ORDER — NITROGLYCERIN 0.4 MG SL SUBL: 0.4000 mg | SUBLINGUAL_TABLET | SUBLINGUAL | 2 refills | Status: DC | PRN

## 2018-01-08 MED ORDER — ASPIRIN 81 MG PO CHEW: 81.0000 mg | CHEWABLE_TABLET | Freq: Every day | ORAL | Status: DC

## 2018-01-08 MED ORDER — TICAGRELOR 90 MG PO TABS: 90.0000 mg | ORAL_TABLET | Freq: Two times a day (BID) | ORAL | 2 refills | Status: DC

## 2018-01-08 NOTE — Care Management Note (Addendum)
Case Management Note  Patient Details  Name: Rodney Rangel MRN: [id number] Date of Birth: 1966/07/25  Subjective/Objective:  From home, pta indep, s/p coronary stent intervention , will be on brilinta, NCM gave patient the 30 day free savings card and the $5 co pay card.  NCM informed patient to use the $5 co pay card for refill when he has about a week supply left if he can not use the $5 co pay card and he has to pay 173.43 , to let his cardiologist know and he will switch him to something else.   54 NCM received email from [place] stating patient has deductible of 1400.00 that has not been met.  NCM will notify [place] PA.                     Action/Plan: DC home.   Expected Discharge Date:  (unknown)               Expected Discharge Plan:  Home/Self Care  In-House Referral:     Discharge planning Services  CM Consult  Post Acute Care Choice:    Choice offered to:     DME Arranged:    DME Agency:     HH Arranged:    [place] Agency:     Status of Service:  Completed, signed off  If discussed at [employer] of Stay Meetings, dates discussed:    Additional Comments:  Zenon Mayo, RN 01/08/2018, 9:31 AM

## 2018-01-08 NOTE — Telephone Encounter (Signed)
New message   [facility] at 8:30 with Sherre PootLuke Kilroy,PA per Mardella LaymanLindsey at hospital

## 2018-01-08 NOTE — Progress Notes (Addendum)
Co-pay for Brilinta 90 mg.twice a day for 30 day supply $173.43.  No PA required.  Deductible of 1400.00 not met.  Pt. May use any pharmacy.

## 2018-01-08 NOTE — Telephone Encounter (Signed)
TOC, current admit, possible d/c today 3/15

## 2018-01-08 NOTE — Discharge Summary (Addendum)
Discharge Summary    Patient ID: Rodney Rangel,  MRN: [id number], DOB/AGE: July 03, 1966 52 y.o.  Admit date: 01/07/2018 Discharge date: 01/08/2018  Primary Care Provider: Deatra Rangel, Rodney Rangel Primary Cardiologist: Rodney Rangel   Discharge Diagnoses    Principal Problem:   Chest pain Active Problems:   Hyperlipidemia   ACS (acute coronary syndrome) [facility](HCC)   Unstable angina (HCC)   Allergies No Known Allergies  Diagnostic Studies/Procedures    Cath: 01/07/18  Conclusion     Prox RCA lesion is 85% stenosed.  The left ventricular systolic function is normal.  LV end diastolic pressure is normal.  The left ventricular ejection fraction is 55-65% by visual estimate.  Post intervention, there is a 0% residual stenosis.  A drug-eluting stent was successfully placed using a STENT SYNERGY DES 3.5X16.  A drug-eluting stent was successfully placed using a STENT SYNERGY DES 3.5X8.   1. Single vessel obstructive CAD 2. Normal LV function 3. Normal LVEDP 4. Successful stenting of the proximal RCA with DES x 2.  Plan: DAPT for one year. Anticipate DC in am. Risk factor modification.   _____________   History of Present Illness     Rodney Rangel is a 52 yo male with PMH of tobacco use and HL who does not follow with cardiology, but does regularly see his PCP. [facility] after sudden onset chest pain yesterday about 1400. 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 The day prior to admission at approximately 2pm after climbing stairs, he had sudden onset of substernal crushing chest pain rated 10/10. He became short of breath, nauseous, and diaphoretic. The onsite nurse and fire department were dispatched. The pain lasted about 30 min and subsided with rest in the air conditioning. His wife [facility]. On arrival, troponin remained negative and EKG  with TWI in lead III that resolved on repeat EKG. He received ASA, no nitro, and has not had a recurrence in his chest pain since presented to the ED. He does report 2-3 month history of chest pain with activity rated as a 5/10 that always resolved with rest and was not associated with nausea or diaphoresis.   He is a current smoker who now vapes, but used to smoke 1 ppd for 30 years. He stated that his PCP told him his last cholesterol test was high. He has also been told that he is pre-diabetic. He has a family history of heart disease in his father (unknown age of his first heart attack, but did have CABG in his 2270s).  D-dimer negative and BNP WNL. Given his symptoms were concerning for exertional chest pain with ACS [facility].   Hospital Course     Underwent cardiac cath noted above with single vessel CAD with successful PCI/DES x2 to the pRCA.  Plan for DAPT with ASA/Brilinta for at least one year. Has been on Crestor 40mg  daily with LDL 86. Hgb A1c 6.2. No complications noted post cath. No further chest pain. Worked well with cardiac rehab. Instructed to follow up closely with his PCP regarding his Hgb A1c reading and further management. Smoking cessation was strongly encouraged. No room for BB/ACEi 2/2 to bradycardia and soft blood pressures during admission. Consider adding at outpatient follow up.   General: Well developed, well nourished, male appearing in no acute distress. Head: Normocephalic, atraumatic.  Neck: Supple without bruits, JVD. Lungs:  Resp regular and unlabored, CTA. Heart: RRR, S1, S2, no S3, S4, 2/6 soft systolic murmur; no rub. Abdomen: Soft, non-tender, non-distended with normoactive bowel sounds. No hepatomegaly. No rebound/guarding. No obvious abdominal masses. Extremities: No clubbing, cyanosis, edema. Distal pedal pulses are 2+ bilaterally. R radial cath site stable without bruising or hematoma Neuro: Alert and oriented X 3. Moves all  extremities spontaneously. Psych: Normal affect.  Rodney Rangel was seen by Dr. Allyson Rangel and determined stable for discharge home. Follow up in the office has been arranged. Medications are listed below.   _____________  Discharge Vitals Blood pressure 105/78, pulse 64, temperature 97.7 F (36.5 C), temperature source Oral, resp. rate 16, weight 232 lb 2.3 oz (105.3 kg), SpO2 97 %.  Filed Weights   01/07/18 2100 01/08/18 0458  Weight: 233 lb 11 oz (106 kg) 232 lb 2.3 oz (105.3 kg)    Labs & Radiologic Studies    CBC Recent Labs    01/07/18 0630 01/08/18 0326  WBC 9.1 7.2  HGB 13.7 12.2*  HCT 42.2 38.9*  MCV 94.8 93.3  PLT 198 160   Basic Metabolic Panel Recent Labs    16/10/96 1727 01/07/18 0630 01/08/18 0326  NA 142  --  139  K 4.3  --  4.1  CL 105  --  107  CO2 26  --  24  GLUCOSE 107*  --  142*  BUN 16  --  18  CREATININE 0.96 1.07 1.10  CALCIUM 9.6  --  8.5*   Liver Function Tests Recent Labs    01/07/18 0637  AST 19  ALT 22  ALKPHOS 97  BILITOT 0.3  PROT 6.5  ALBUMIN 3.8   No results for input(s): LIPASE, AMYLASE in the last 72 hours. Cardiac Enzymes Recent Labs    01/07/18 0214 01/07/18 0630 01/07/18 1259  TROPONINI <0.03 <0.03 <0.03   BNP Invalid input(s): POCBNP D-Dimer Recent Labs    01/07/18 0655  DDIMER 0.27   Hemoglobin A1C Recent Labs    01/07/18 0637  HGBA1C 6.2*   Fasting Lipid Panel Recent Labs    01/07/18 0637  CHOL 148  HDL 30*  LDLCALC 86  TRIG 045*  CHOLHDL 4.9   Thyroid Function Tests No results for input(s): TSH, T4TOTAL, T3FREE, THYROIDAB in the last 72 hours.  Invalid input(s): FREET3 _____________  Dg Chest 2 View  Result Date: 01/06/2018 CLINICAL DATA:  Dizziness, chest pain EXAM: CHEST - 2 VIEW COMPARISON:  12/10/2007 FINDINGS: Heart and mediastinal contours are within normal limits. No focal opacities or effusions. No acute bony abnormality. IMPRESSION: No active cardiopulmonary disease.  Electronically Signed   By: Charlett Nose M.D.   On: 01/06/2018 18:21   Disposition   Pt is being discharged home today in good condition.  Follow-up Plans & Appointments    Follow-up Information    Rodney James, MD Follow up.   Specialty:  Family Medicine Why:  Please follow up regarding further management of your diabetes within the next 2 weeks.  Contact information: [address] [place] [address] [phone]        Abelino Derrick, PA-C Follow up on 01/22/2018.   Specialties:  Cardiology, Radiology Why:  at 8:30am for your follow up appt.  Contact information: [address] [place] [address] [phone]          Discharge Instructions    AMB Referral to Cardiac Rehabilitation - Phase  II   Complete by:  As directed    Diagnosis:  Coronary Stents   Amb Referral to Cardiac Rehabilitation   Complete by:  As directed    Diagnosis:  Coronary Stents   Call MD for:  redness, tenderness, or signs of infection (pain, swelling, redness, odor or green/yellow discharge around incision site)   Complete by:  As directed    Diet - low sodium heart healthy   Complete by:  As directed    Discharge instructions   Complete by:  As directed    Radial Site Care Refer to this sheet in the next few weeks. These instructions provide you with information on caring for yourself after your procedure. Your caregiver may also give you more specific instructions. Your treatment has been planned according to current medical practices, but problems sometimes occur. Call your caregiver if you have any problems or questions after your procedure. HOME CARE INSTRUCTIONS You may shower the day after the procedure.Remove the bandage (dressing) and gently wash the site with plain soap and water.Gently pat the site dry.  Do not apply powder or lotion to the site.  Do not submerge the affected site in water for 3 to 5 days.  Inspect the site at least twice daily.    Do not flex or bend the affected arm for 24 hours.  No lifting over 5 pounds (2.3 kg) for 5 days after your procedure.  Do not drive home if you are discharged the same day of the procedure. Have someone else drive you.  You may drive 24 hours after the procedure unless otherwise instructed by your caregiver.  What to expect: Any bruising will usually fade within 1 to 2 weeks.  Blood that collects in the tissue (hematoma) may be painful to the touch. It should usually decrease in size and tenderness within 1 to 2 weeks.  SEEK IMMEDIATE MEDICAL CARE IF: You have unusual pain at the radial site.  You have redness, warmth, swelling, or pain at the radial site.  You have drainage (other than a small amount of blood on the dressing).  You have chills.  You have a fever or persistent symptoms for more than 72 hours.  You have a fever and your symptoms suddenly get worse.  Your arm becomes pale, cool, tingly, or numb.  You have heavy bleeding from the site. Hold pressure on the site.   PLEASE DO NOT MISS ANY DOSES OF YOUR BRILINTA!!!!! Also keep a log of you blood pressures and bring back to your follow up appt. Please call the office with any questions.   Patients taking blood thinners should generally stay away from medicines like ibuprofen, Advil, Motrin, naproxen, and Aleve due to risk of stomach bleeding. You may take Tylenol as directed or talk to your primary doctor about alternatives.   Increase activity slowly   Complete by:  As directed       Discharge Medications     Medication List    STOP taking these medications   methocarbamol 500 MG tablet Commonly known as:  ROBAXIN   oxyCODONE-acetaminophen 5-325 MG tablet Commonly known as:  PERCOCET/ROXICET     TAKE these medications   aspirin 81 MG chewable tablet Chew 1 tablet (81 mg total) by mouth daily. Start taking on:  01/09/2018   gabapentin 800 MG tablet Commonly known as:  NEURONTIN Take 800 mg by mouth daily.    nitroGLYCERIN 0.4 MG SL tablet Commonly known as:  NITROSTAT Place 1 tablet (  0.4 mg total) under the tongue every 5 (five) minutes as needed.   PARoxetine 40 MG tablet Commonly known as:  PAXIL Take 40 mg by mouth daily.   rosuvastatin 40 MG tablet Commonly known as:  CRESTOR Take 40 mg by mouth daily.   ticagrelor 90 MG Tabs tablet Commonly known as:  BRILINTA Take 1 tablet (90 mg total) by mouth 2 (two) times daily.        Aspirin prescribed at discharge?  Yes High Intensity Statin Prescribed? (Lipitor 40-80mg  or Crestor 20-40mg ): Yes Beta Blocker Prescribed? No: Bradycardia For EF <40%, was ACEI/ARB Prescribed? No: soft blood pressures, EF normal ADP Receptor Inhibitor Prescribed? (i.e. Plavix etc.-Includes Medically Managed Patients): Yes For EF <40%, Aldosterone Inhibitor Prescribed? No: EF ok Was EF assessed during THIS hospitalization? Yes Was Cardiac Rehab II ordered? (Included Medically managed Patients): Yes   Outstanding Labs/Studies   N/a   Duration of Discharge Encounter   Greater than 30 minutes including physician time.  Signed, Laverda Page NP-C 01/08/2018, 9:56 AM   Agree with note by Laverda Page NP-C  Okay for discharge this morning. Status post RCA PCI and drug-eluting stenting yesterday by Dr. [place]. On dual antiplatelet therapy. Follow-up with the level provider in 7 days and with me in 4-6 weeks.  Runell Gess, M.D., FACP, [facility], Earl Lagos [facility] [address] [place], [place]  [address]  [phone] 01/08/2018 10:35 AM

## 2018-01-08 NOTE — Progress Notes (Signed)
CARDIAC REHAB PHASE I   PRE:  Rate/Rhythm: 67 SR  BP:  Supine: 143/72  Sitting:   Standing:    SaO2:   MODE:  Ambulation: 800 ft   POST:  Rate/Rhythm: 86 SR  BP:  Supine: 140/74  Sitting:   Standing:    SaO2:  0855-0955 Pt walked 800 ft on RA with steady gait. No CP. Education completed with pt and wife who voiced understanding. Stressed importance of brilinta with stent. 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 Reviewed NTG use, risk factors, ex ed, heart healthy food choices and watching carbs. Pt aware of A1C at 6.2 and need to follow up with Medical doctor and watch carbs. Gave tobacco cessation handout and offered fake cigarette. Pt plans to continue with vaping until he can quit completely even if not encouraged. Discussed CRP 2 and will refer to [place].   Luetta Nuttingharlene Cathy Ropp, RN BSN  01/08/2018 9:50 AM

## 2018-01-11 NOTE — Telephone Encounter (Signed)
Left message to call back  

## 2018-01-12 NOTE — Telephone Encounter (Signed)
TOC call no answer.LMTC. 

## 2018-01-13 ENCOUNTER — Telehealth (HOSPITAL_COMMUNITY): Payer: Self-pay

## 2018-01-13 NOTE — Telephone Encounter (Signed)
Patients insurance is active and benefits verified through [employer] - No co-pay, deductible amount of $2,800/$2,800 has been met, out of pocket amount of $8,400/$3,050.61 has been met, 20% co-insurance, and no pre-authorization is required. Passport/reference #[id number] ° °Patient will be contacted for scheduling upon review by the RN Navigator. °

## 2018-01-13 NOTE — Telephone Encounter (Signed)
#   2 TOC no answer.LMTC.

## 2018-01-14 NOTE — Telephone Encounter (Signed)
Left a message to call back.

## 2018-01-15 NOTE — Telephone Encounter (Signed)
Encounter closed as 3 TOC outreach attempts have been made.

## 2018-01-18 ENCOUNTER — Telehealth (HOSPITAL_COMMUNITY): Payer: Self-pay

## 2018-01-18 NOTE — Telephone Encounter (Signed)
Attempted to call patient in regards to Cardiac Rehab

## 2018-01-22 ENCOUNTER — Emergency Department (HOSPITAL_COMMUNITY): Payer: 59

## 2018-01-22 ENCOUNTER — Encounter (HOSPITAL_COMMUNITY): Payer: Self-pay

## 2018-01-22 ENCOUNTER — Ambulatory Visit (INDEPENDENT_AMBULATORY_CARE_PROVIDER_SITE_OTHER): Payer: 59 | Admitting: Cardiology

## 2018-01-22 ENCOUNTER — Observation Stay (HOSPITAL_COMMUNITY)
Admission: EM | Admit: 2018-01-22 | Discharge: 2018-01-22 | Disposition: A | Discharge status: Home / Self Care | Payer: 59 | Attending: Cardiology | Admitting: Cardiology

## 2018-01-22 ENCOUNTER — Other Ambulatory Visit: Payer: Self-pay

## 2018-01-22 ENCOUNTER — Encounter: Payer: Self-pay | Admitting: Cardiology

## 2018-01-22 DIAGNOSIS — I2510 Atherosclerotic heart disease of native coronary artery without angina pectoris: Secondary | ICD-10-CM | POA: Insufficient documentation

## 2018-01-22 DIAGNOSIS — R0789 Other chest pain: Secondary | ICD-10-CM

## 2018-01-22 DIAGNOSIS — Z955 Presence of coronary angioplasty implant and graft: Secondary | ICD-10-CM | POA: Insufficient documentation

## 2018-01-22 DIAGNOSIS — Z8249 Family history of ischemic heart disease and other diseases of the circulatory system: Secondary | ICD-10-CM | POA: Insufficient documentation

## 2018-01-22 DIAGNOSIS — I200 Unstable angina: Secondary | ICD-10-CM

## 2018-01-22 DIAGNOSIS — I700 Atherosclerosis of aorta: Secondary | ICD-10-CM | POA: Diagnosis not present

## 2018-01-22 DIAGNOSIS — K8020 Calculus of gallbladder without cholecystitis without obstruction: Secondary | ICD-10-CM

## 2018-01-22 DIAGNOSIS — I252 Old myocardial infarction: Secondary | ICD-10-CM | POA: Diagnosis not present

## 2018-01-22 DIAGNOSIS — M5137 Other intervertebral disc degeneration, lumbosacral region: Secondary | ICD-10-CM | POA: Insufficient documentation

## 2018-01-22 DIAGNOSIS — Z87891 Personal history of nicotine dependence: Secondary | ICD-10-CM | POA: Diagnosis not present

## 2018-01-22 DIAGNOSIS — E785 Hyperlipidemia, unspecified: Secondary | ICD-10-CM | POA: Diagnosis not present

## 2018-01-22 DIAGNOSIS — I160 Hypertensive urgency: Secondary | ICD-10-CM | POA: Diagnosis not present

## 2018-01-22 DIAGNOSIS — Z9861 Coronary angioplasty status: Secondary | ICD-10-CM

## 2018-01-22 DIAGNOSIS — Z79899 Other long term (current) drug therapy: Secondary | ICD-10-CM | POA: Diagnosis not present

## 2018-01-22 DIAGNOSIS — I10 Essential (primary) hypertension: Secondary | ICD-10-CM | POA: Insufficient documentation

## 2018-01-22 DIAGNOSIS — F17200 Nicotine dependence, unspecified, uncomplicated: Secondary | ICD-10-CM | POA: Diagnosis not present

## 2018-01-22 DIAGNOSIS — Z8546 Personal history of malignant neoplasm of prostate: Secondary | ICD-10-CM | POA: Insufficient documentation

## 2018-01-22 DIAGNOSIS — I25110 Atherosclerotic heart disease of native coronary artery with unstable angina pectoris: Secondary | ICD-10-CM | POA: Diagnosis not present

## 2018-01-22 DIAGNOSIS — M419 Scoliosis, unspecified: Secondary | ICD-10-CM | POA: Insufficient documentation

## 2018-01-22 DIAGNOSIS — R7303 Prediabetes: Secondary | ICD-10-CM | POA: Insufficient documentation

## 2018-01-22 DIAGNOSIS — Z7982 Long term (current) use of aspirin: Secondary | ICD-10-CM | POA: Diagnosis not present

## 2018-01-22 DIAGNOSIS — R079 Chest pain, unspecified: Secondary | ICD-10-CM | POA: Diagnosis not present

## 2018-01-22 DIAGNOSIS — K429 Umbilical hernia without obstruction or gangrene: Secondary | ICD-10-CM | POA: Insufficient documentation

## 2018-01-22 DIAGNOSIS — R072 Precordial pain: Secondary | ICD-10-CM

## 2018-01-22 DIAGNOSIS — F319 Bipolar disorder, unspecified: Secondary | ICD-10-CM | POA: Diagnosis not present

## 2018-01-22 DIAGNOSIS — R1013 Epigastric pain: Secondary | ICD-10-CM | POA: Diagnosis not present

## 2018-01-22 LAB — CBC WITH DIFFERENTIAL/PLATELET
BASOS PCT: 0 %
Basophils Absolute: 0 10*3/uL (ref 0.0–0.1)
Eosinophils Absolute: 0.1 10*3/uL (ref 0.0–0.7)
Eosinophils Relative: 1 %
HEMATOCRIT: 42.1 % (ref 39.0–52.0)
HEMOGLOBIN: 13.8 g/dL (ref 13.0–17.0)
LYMPHS ABS: 1.7 10*3/uL (ref 0.7–4.0)
Lymphocytes Relative: 17 %
MCH: 29.7 pg (ref 26.0–34.0)
MCHC: 32.8 g/dL (ref 30.0–36.0)
MCV: 90.5 fL (ref 78.0–100.0)
Monocytes Absolute: 0.4 10*3/uL (ref 0.1–1.0)
Monocytes Relative: 4 %
NEUTROS ABS: 7.4 10*3/uL (ref 1.7–7.7)
NEUTROS PCT: 78 %
Platelets: 190 10*3/uL (ref 150–400)
RBC: 4.65 MIL/uL (ref 4.22–5.81)
RDW: 12.8 % (ref 11.5–15.5)
WBC: 9.6 10*3/uL (ref 4.0–10.5)

## 2018-01-22 LAB — COMPREHENSIVE METABOLIC PANEL
ALBUMIN: 4.1 g/dL (ref 3.5–5.0)
ALT: 26 U/L (ref 17–63)
ANION GAP: 10 (ref 5–15)
AST: 24 U/L (ref 15–41)
Alkaline Phosphatase: 94 U/L (ref 38–126)
BUN: 14 mg/dL (ref 6–20)
CHLORIDE: 105 mmol/L (ref 101–111)
CO2: 22 mmol/L (ref 22–32)
Calcium: 10.5 mg/dL — ABNORMAL HIGH (ref 8.9–10.3)
Creatinine, Ser: 1.02 mg/dL (ref 0.61–1.24)
GFR calc Af Amer: >60 mL/min (ref >60)
GFR calc non Af Amer: >60 mL/min (ref >60)
Glucose, Bld: 161 mg/dL — ABNORMAL HIGH (ref 65–99)
POTASSIUM: 4.3 mmol/L (ref 3.5–5.1)
SODIUM: 137 mmol/L (ref 135–145)
Total Bilirubin: 0.5 mg/dL (ref 0.3–1.2)
Total Protein: 6.7 g/dL (ref 6.5–8.1)

## 2018-01-22 LAB — PROTIME-INR
INR: 1.06
Prothrombin Time: 13.7 seconds (ref 11.4–15.2)

## 2018-01-22 LAB — LIPASE, BLOOD: LIPASE: 31 U/L (ref 11–51)

## 2018-01-22 LAB — I-STAT TROPONIN, ED: Troponin i, poc: 0 ng/mL (ref 0.00–0.08)

## 2018-01-22 LAB — BRAIN NATRIURETIC PEPTIDE: B Natriuretic Peptide: 34.1 pg/mL (ref 0.0–100.0)

## 2018-01-22 LAB — TROPONIN I: Troponin I: <0.03 ng/mL (ref <0.03)

## 2018-01-22 MED ORDER — PANTOPRAZOLE SODIUM 40 MG IV SOLR
40.0000 mg | Freq: Once | INTRAVENOUS | Status: AC
  Administered 2018-01-22: 40 mg via INTRAVENOUS
  Filled 2018-01-22: qty 40

## 2018-01-22 MED ORDER — HYDROMORPHONE HCL 1 MG/ML IJ SOLN
1.0000 mg | Freq: Once | INTRAMUSCULAR | Status: AC
  Administered 2018-01-22: 1 mg via INTRAVENOUS
  Filled 2018-01-22: qty 1

## 2018-01-22 MED ORDER — IOPAMIDOL (ISOVUE-370) INJECTION 76%
INTRAVENOUS | Status: AC
  Administered 2018-01-22: 100 mL
  Filled 2018-01-22: qty 100

## 2018-01-22 MED ORDER — NITROGLYCERIN 0.4 MG SL SUBL: 0.4000 mg | SUBLINGUAL_TABLET | SUBLINGUAL | Status: DC | PRN

## 2018-01-22 MED ORDER — ISOSORBIDE MONONITRATE ER 30 MG PO TB24: 30.0000 mg | ORAL_TABLET | Freq: Every day | ORAL | 0 refills | Status: DC

## 2018-01-22 MED ORDER — NITROGLYCERIN 0.4 MG SL SUBL
0.4000 mg | SUBLINGUAL_TABLET | SUBLINGUAL | Status: DC | PRN
  Administered 2018-01-22 (×3): 0.4 mg via SUBLINGUAL
  Filled 2018-01-22: qty 1

## 2018-01-22 MED ORDER — FAMOTIDINE IN NACL 20-0.9 MG/50ML-% IV SOLN
20.0000 mg | Freq: Once | INTRAVENOUS | Status: AC
  Administered 2018-01-22: 20 mg via INTRAVENOUS
  Filled 2018-01-22: qty 50

## 2018-01-22 MED ORDER — NITROGLYCERIN 2 % TD OINT
1.0000 [in_us] | TOPICAL_OINTMENT | Freq: Four times a day (QID) | TRANSDERMAL | Status: DC
  Administered 2018-01-22: 1 [in_us] via TOPICAL
  Filled 2018-01-22: qty 1

## 2018-01-22 MED ORDER — OXYCODONE-ACETAMINOPHEN 5-325 MG PO TABS: 1.0000 | ORAL_TABLET | Freq: Four times a day (QID) | ORAL | 0 refills | Status: DC | PRN

## 2018-01-22 MED ORDER — LABETALOL HCL 5 MG/ML IV SOLN
10.0000 mg | Freq: Once | INTRAVENOUS | Status: AC
  Administered 2018-01-22: 10 mg via INTRAVENOUS
  Filled 2018-01-22: qty 4

## 2018-01-22 MED ORDER — OMEPRAZOLE 20 MG PO CPDR: 20.0000 mg | DELAYED_RELEASE_CAPSULE | Freq: Every day | ORAL | 0 refills | Status: DC

## 2018-01-22 NOTE — ED Provider Notes (Signed)
[facility] Provider Note   CSN: [id number] Arrival date & time: 01/22/18  0920     History   Chief Complaint Chief Complaint  Patient presents with  . Chest Pain    HPI Rodney Rangel is a 52 y.o. male.  HPI Patient is status post a proximal RCA DES stent 2 weeks ago.  He reports he is compliant with his Brilinta.  He was doing well.  He reports last night he decided to splurge on his diet.  He reports he had a burrito at about 11 PM.  About an hour before going to his schedule follow-up appointment with cardiology, he started to develop a burning pain in his epigastrium and lower chest that radiated through to the back.  He thought he probably had indigestion from eating a burrito late last night.  He chewed about 10 times before he went to his cardiologist appointment.  This did not relieve the symptoms.  He was visibly uncomfortable in the appointment.  They tried one sublingual nitroglycerin after which he became clammy and nauseated and vomited.  He was given aspirin and morphine.  Patient continued to have pain.  EKG in the office did not show acute changes.  He was referred to the emergency department. Past Medical History:  Diagnosis Date  . Coronary artery disease    3/19 PCI/DESx2 to pRCA  . Hyperlipidemia   . Pre-diabetes     Patient Active Problem List   Diagnosis Date Noted  . CAD S/P percutaneous coronary angioplasty 01/22/2018  . Former smoker 01/22/2018  . Family history of premature CAD 01/22/2018  . Chest pain 01/22/2018  . Coronary artery disease involving native coronary artery of native heart without angina pectoris   . Dyslipidemia 01/07/2018  . Unstable angina [facility](HCC)     Past Surgical History:  Procedure Laterality Date  . BACK SURGERY    . CARDIAC CATHETERIZATION  01/07/2018  . CORONARY STENT INTERVENTION N/A 01/07/2018   Procedure: CORONARY STENT INTERVENTION;  Surgeon: [place], Peter M, MD;  [facility];  Service: Cardiovascular;  Laterality: N/A;  . CORONARY STENT PLACEMENT  01/07/2018  . LEFT HEART CATH AND CORONARY ANGIOGRAPHY N/A 01/07/2018   Procedure: LEFT HEART CATH AND CORONARY ANGIOGRAPHY;  Surgeon: [place], Peter M, MD;  [facility];  Service: Cardiovascular;  Laterality: N/A;  . NECK SURGERY          Home Medications    Prior to Admission medications   Medication Sig Start Date End Date Taking? Authorizing Provider  aspirin 81 MG chewable tablet Chew 1 tablet (81 mg total) by mouth daily. 01/09/18  Yes Laverda Pageoberts, Lindsay B, NP  gabapentin (NEURONTIN) 800 MG tablet Take 800 mg by mouth daily. 11/20/17  Yes [provider]  nitroGLYCERIN (NITROSTAT) 0.4 MG SL tablet Place 1 tablet (0.4 mg total) under the tongue every 5 (five) minutes as needed. 01/08/18  Yes Laverda Pageoberts, Lindsay B, NP  PARoxetine (PAXIL) 40 MG tablet Take 40 mg by mouth daily. 11/20/17  Yes [provider]  rosuvastatin (CRESTOR) 40 MG tablet Take 40 mg by mouth daily.   Yes [provider]  ticagrelor (BRILINTA) 90 MG TABS tablet Take 1 tablet (90 mg total) by mouth 2 (two) times daily. 01/08/18  Yes Laverda Pageoberts, Lindsay B, NP  isosorbide mononitrate (IMDUR) 30 MG 24 hr tablet Take 1 tablet (30 mg total) by mouth daily. 01/22/18   Arby BarrettePfeiffer, Alycia Cooperwood, MD  omeprazole (PRILOSEC)  20 MG capsule Take 1 capsule (20 mg total) by mouth daily. 01/22/18   Arby Barrette, MD  oxyCODONE-acetaminophen (PERCOCET) 5-325 MG tablet Take 1-2 tablets by mouth every 6 (six) hours as needed. 01/22/18   Arby Barrette, MD    Family History Family History  Problem Relation Age of Onset  . CAD Father   . Lung cancer Father     Social History Social History   Tobacco Use  . Smoking status: Current Some Day Smoker  . Smokeless tobacco: Never Used  Substance Use Topics  . Alcohol use: No  . Drug use: No     Allergies   Patient has no known allergies.   Review of Systems Review  of Systems 10 Systems reviewed and are negative for acute change except as noted in the HPI.   Physical Exam Updated Vital Signs BP (!) 128/92   Pulse 69   Temp 97.7 F (36.5 C) (Oral)   Resp 16   SpO2 96%   Physical Exam  Constitutional: He is oriented to person, place, and time. He appears well-developed and well-nourished.  Patient is alert without respiratory distress.  He does appear uncomfortable and is grimacing.  Color is good.  Patient is not diaphoretic.  HENT:  Head: Normocephalic and atraumatic.  Mouth/Throat: Oropharynx is clear and moist.  Eyes: Conjunctivae and EOM are normal.  Neck: Neck supple.  Cardiovascular: Normal rate, regular rhythm, normal heart sounds and intact distal pulses.  No murmur heard. Pulmonary/Chest: Effort normal and breath sounds normal. No respiratory distress.  Abdominal: Soft. He exhibits no distension. There is no tenderness. There is no guarding.  Musculoskeletal: Normal range of motion. He exhibits no edema or tenderness.  Neurological: He is alert and oriented to person, place, and time. No cranial nerve deficit. He exhibits normal muscle tone. Coordination normal.  Skin: Skin is warm and dry.  Psychiatric: He has a normal mood and affect.  Nursing note and vitals reviewed.    ED Treatments / Results  Labs (all labs ordered are listed, but only abnormal results are displayed) Labs Reviewed  COMPREHENSIVE METABOLIC PANEL - Abnormal; Notable for the following components:      Result Value   Glucose, Bld 161 (*)    Calcium 10.5 (*)    All other components within normal limits  BRAIN NATRIURETIC PEPTIDE  CBC WITH DIFFERENTIAL/PLATELET  PROTIME-INR  LIPASE, BLOOD  TROPONIN I  TROPONIN I  TROPONIN I  I-STAT TROPONIN, ED    EKG EKG Interpretation  Date/Time:  Friday January 22 2018 10:28:06 EDT Ventricular Rate:  59 PR Interval:    QRS Duration: 93 QT Interval:  411 QTC Calculation: 408 R Axis:   78 Text  Interpretation:  Sinus rhythm RSR' in V1 or V2, right VCD or RVH no interval change from previous Confirmed by Arby Barrette [phone]) on 01/22/2018 11:44:00 AM   Radiology Dg Chest Port 1 View  Result Date: 01/22/2018 CLINICAL DATA:  Chest pain EXAM: PORTABLE CHEST 1 VIEW COMPARISON:  January 06, 2018 FINDINGS: There is no edema or consolidation. Heart is upper normal in size with pulmonary vascularity within normal limits. No adenopathy. There is upper thoracic levoscoliosis. There is postoperative change in the lower cervical region. IMPRESSION: No edema or consolidation.  Heart size normal.  Scoliosis. Electronically Signed   By: Bretta Bang III M.D.   On: 01/22/2018 10:31   Ct Angio Chest/abd/pel For Dissection W And/or W/wo  Result Date: 01/22/2018 CLINICAL DATA:  Substernal chest pain  and epigastric pain. EXAM: CT ANGIOGRAPHY CHEST, ABDOMEN AND PELVIS TECHNIQUE: Multidetector CT imaging through the chest, abdomen and pelvis was performed using the standard protocol during bolus administration of intravenous contrast. Multiplanar reconstructed images and MIPs were obtained and reviewed to evaluate the vascular anatomy. CONTRAST:  ISOVUE-370 IOPAMIDOL (ISOVUE-370) INJECTION 76% COMPARISON:  CT abdomen pelvis dated January 07, 2005. FINDINGS: CTA CHEST FINDINGS Cardiovascular: No evidence of thoracic aortic aneurysm or dissection. The heart size is at the upper limits of normal. Stent in the right coronary artery. No pericardial effusion. No pulmonary embolism. Mediastinum/Nodes: No enlarged mediastinal, hilar, or axillary lymph nodes. Thyroid gland, trachea, and esophagus demonstrate no significant findings. Lungs/Pleura: Minimal bibasilar atelectasis. No focal consolidation, pleural effusion, or pneumothorax. No suspicious pulmonary nodule. Musculoskeletal: No chest wall abnormality. No acute or significant osseous findings. Review of the MIP images confirms the above findings. CTA ABDOMEN AND  PELVIS FINDINGS VASCULAR Aorta: Focal ectasia of the infrarenal abdominal aorta measuring 2.1 cm. No aneurysm, dissection, vasculitis or significant stenosis. Mild calcified and noncalcified atherosclerosis. Celiac: Patent without evidence of aneurysm, dissection, vasculitis or significant stenosis. Median arcuate configuration. SMA: Patent without evidence of aneurysm, dissection, vasculitis or significant stenosis. Renals: Both renal arteries are patent without evidence of aneurysm, dissection, vasculitis, fibromuscular dysplasia or significant stenosis. IMA: Patent without evidence of aneurysm, dissection, vasculitis or significant stenosis. 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 No aneurysm. Remaining inflow vessels are patent without evidence of aneurysm, dissection, vasculitis or significant stenosis. Veins: No obvious venous abnormality within the limitations of this arterial phase study. Review of the MIP images confirms the above findings. NON-VASCULAR Hepatobiliary: Subcentimeter low-density lesion in segment 8 is too small to characterize. Cholelithiasis. No biliary dilatation. Pancreas: Unremarkable. No pancreatic ductal dilatation or surrounding inflammatory changes. Spleen: Normal in size without focal abnormality. Adrenals/Urinary Tract: Adrenal glands are unremarkable. Kidneys are normal, without renal calculi, focal lesion, or hydronephrosis. Bladder is unremarkable. Stomach/Bowel: Stomach is within normal limits. Appendix appears normal. No evidence of bowel wall thickening, distention, or inflammatory changes. Lymphatic: No significant vascular findings are present. No enlarged abdominal or pelvic lymph nodes. Reproductive: Prostate is unremarkable. Other: Small fat containing umbilical hernia. No pneumoperitoneum or free fluid. Musculoskeletal: No acute or significant osseous findings. Degenerative disc disease at L5-S1. Review of the MIP  images confirms the above findings. IMPRESSION: 1. No evidence of thoracic or abdominal aortic dissection. 2. Mild focal ectasia of the infrarenal abdominal aorta, measuring up to 2.1 cm. 3. 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 4.  Aortic atherosclerosis (ICD10-I70.0). 5. Cholelithiasis. Electronically Signed   By: Obie Dredge M.D.   On: 01/22/2018 13:59    Procedures Procedures (including critical care time) CRITICAL CARE Performed by: Cristy Friedlander   Total critical care time:45 minutes  Critical care time was exclusive of separately billable procedures and treating other patients.  Critical care was necessary to treat or prevent imminent or life-threatening deterioration.  Critical care was time spent personally by me on the following activities: development of treatment plan with patient and/or surrogate as well as nursing, discussions with consultants, evaluation of patient's response to treatment, examination of patient, obtaining history from patient or surrogate, ordering and performing treatments and interventions, ordering and review of laboratory studies, ordering and review of radiographic studies, pulse oximetry and re-evaluation of patient's condition. Medications Ordered in ED Medications  nitroGLYCERIN (NITROSTAT) SL tablet 0.4 mg (0.4 mg  Sublingual Given 01/22/18 1245)  nitroGLYCERIN (NITROGLYN) 2 % ointment 1 inch (1 inch Topical Given 01/22/18 1300)  pantoprazole (PROTONIX) injection 40 mg (40 mg Intravenous Given 01/22/18 1045)  famotidine (PEPCID) IVPB 20 mg premix (0 mg Intravenous Stopped 01/22/18 1130)  HYDROmorphone (DILAUDID) injection 1 mg (1 mg Intravenous Given 01/22/18 1258)  iopamidol (ISOVUE-370) 76 % injection (100 mLs  Contrast Given 01/22/18 1326)  labetalol (NORMODYNE,TRANDATE) injection 10 mg (10 mg Intravenous Given 01/22/18 1431)  HYDROmorphone (DILAUDID) injection 1 mg (1 mg Intravenous Given 01/22/18 1431)      Initial Impression / Assessment and Plan / ED Course  I have reviewed the triage vital signs and the nursing notes.  Pertinent labs & imaging results that were available during my care of the patient were reviewed by me and considered in my medical decision making (see chart for details).  Clinical Course as of Jan 22 1625  Fri Jan 22, 2018  1238 Cardiology will see in consultation.   [MP]    Clinical Course User Index [MP] Arby Barrette, MD    11: 40 patient continues to have pain.  Blood pressures remain elevated.  Will have nitroglycerin trial and proceed with CT dissection study.  EKG has remained unchanged.  Troponin is negative. 12;45 with 2 nitroglycerin, patient's blood pressure is in the 130s over 80s.  Heart rate 80s.  Sinus rhythm on monitor.  No change in epigastric\lower chest pain.  Has remained consistent in quality and severity.  Will apply 1 inch of nitroglycerin paste, administer Dilaudid IV and patient will go to CT for dissection study.  Patient's clinical appearance remains good.  His color is good his mental status is good no respiratory distress.  Final Clinical Impressions(s) / ED Diagnoses   Final diagnoses:  Hypertensive urgency  Other chest pain  Coronary artery disease involving native heart without angina pectoris, unspecified vessel or lesion type  Calculus of gallbladder without cholecystitis without obstruction   Patient has CTA does not show any dissection.  Gallstones are identified.  Patient hypertension improved with nitroglycerin sublingual and paste.  Patient has been seen by cardiology.  At this time acute MI or stent closure has been ruled out.  Plan will be to start the patient on Imdur and follow-up with cardiology.  Pain has high probability of being secondary to gallstones based on persistence and quality of pain.  Patient is to follow-up with surgery for further evaluation of biliary colic.  Dietary measures are reviewed. ED Discharge  Orders        Ordered    isosorbide mononitrate (IMDUR) 30 MG 24 hr tablet  Daily     01/22/18 1625    oxyCODONE-acetaminophen (PERCOCET) 5-325 MG tablet  Every 6 hours PRN     01/22/18 1625    omeprazole (PRILOSEC) 20 MG capsule  Daily     01/22/18 1625       Arby Barrette, MD 01/22/18 1627

## 2018-01-22 NOTE — Assessment & Plan Note (Signed)
Father had an MI 

## 2018-01-22 NOTE — Assessment & Plan Note (Signed)
LDL 86 March 2019- high dose statin added

## 2018-01-22 NOTE — Assessment & Plan Note (Signed)
Pt presented to the office for post MI f/u and had chest pain, nausea, vomiting-[facility]

## 2018-01-22 NOTE — ED Notes (Signed)
Pt verbalized understanding discharge instructions and denies any further needs or questions at this time. VS stable, ambulatory and steady gait.   

## 2018-01-22 NOTE — Progress Notes (Addendum)
Progress Note  Patient Name: Rodney Rangel Date of Encounter: 01/22/2018  Primary Cardiologist: Rollene Rotunda, MD   Subjective   Still with mild chest pain but much improved after IV pain medication.   Inpatient Medications    Scheduled Meds: . nitroGLYCERIN  1 inch Topical Q6H   Continuous Infusions:  PRN Meds: nitroGLYCERIN   Vital Signs    Vitals:   01/22/18 1330 01/22/18 1400 01/22/18 1430 01/22/18 1502  BP: (!) 149/84 (!) 157/94 (!) 146/87 (!) 128/92  Pulse: 60 68 67 69  Resp: 14 16 14 16   Temp:      TempSrc:      SpO2: 97% 95% 95% 96%    Intake/Output Summary (Last 24 hours) at 01/22/2018 1527 Last data filed at 01/22/2018 1130 Gross per 24 hour  Intake 50 ml  Output -  Net 50 ml   There were no vitals filed for this visit.  Telemetry    NSR no ischemic abnormalities, unchanged from previous EKG 01/07/18 - Personally Reviewed  ECG    NSR - Personally Reviewed  Physical Exam   GEN: No acute distress.   Neck: No JVD Cardiac: RRR, no murmurs, rubs, or gallops.  Respiratory: Clear to auscultation bilaterally. GI: Soft, nontender, non-distended  MS: No edema; No deformity. Neuro:  Nonfocal  Psych: Normal affect   Labs    Chemistry Recent Labs  Lab 01/22/18 1026  NA 137  K 4.3  CL 105  CO2 22  GLUCOSE 161*  BUN 14  CREATININE 1.02  CALCIUM 10.5*  PROT 6.7  ALBUMIN 4.1  AST 24  ALT 26  ALKPHOS 94  BILITOT 0.5  GFRNONAA >60  GFRAA >60  ANIONGAP 10     Hematology Recent Labs  Lab 01/22/18 1026  WBC 9.6  RBC 4.65  HGB 13.8  HCT 42.1  MCV 90.5  MCH 29.7  MCHC 32.8  RDW 12.8  PLT 190    Cardiac EnzymesNo results for input(s): TROPONINI in the last 168 hours.  Recent Labs  Lab 01/22/18 1041  TROPIPOC 0.00     BNP Recent Labs  Lab 01/22/18 1026  BNP 34.1     DDimer No results for input(s): DDIMER in the last 168 hours.   Radiology    Dg Chest Port 1 View  Result Date: 01/22/2018 CLINICAL DATA:  Chest  pain EXAM: PORTABLE CHEST 1 VIEW COMPARISON:  January 06, 2018 FINDINGS: There is no edema or consolidation. Heart is upper normal in size with pulmonary vascularity within normal limits. No adenopathy. There is upper thoracic levoscoliosis. There is postoperative change in the lower cervical region. IMPRESSION: No edema or consolidation.  Heart size normal.  Scoliosis. Electronically Signed   By: Bretta Bang III M.D.   On: 01/22/2018 10:31   Ct Angio Chest/abd/pel For Dissection W And/or W/wo  Result Date: 01/22/2018 CLINICAL DATA:  Substernal chest pain and epigastric pain. EXAM: CT ANGIOGRAPHY CHEST, ABDOMEN AND PELVIS TECHNIQUE: Multidetector CT imaging through the chest, abdomen and pelvis was performed using the standard protocol during bolus administration of intravenous contrast. Multiplanar reconstructed images and MIPs were obtained and reviewed to evaluate the vascular anatomy. CONTRAST:  ISOVUE-370 IOPAMIDOL (ISOVUE-370) INJECTION 76% COMPARISON:  CT abdomen pelvis dated January 07, 2005. FINDINGS: CTA CHEST FINDINGS Cardiovascular: No evidence of thoracic aortic aneurysm or dissection. The heart size is at the upper limits of normal. Stent in the right coronary artery. No pericardial effusion. No pulmonary embolism. Mediastinum/Nodes: No enlarged mediastinal, hilar,  or axillary lymph nodes. Thyroid gland, trachea, and esophagus demonstrate no significant findings. Lungs/Pleura: Minimal bibasilar atelectasis. No focal consolidation, pleural effusion, or pneumothorax. No suspicious pulmonary nodule. Musculoskeletal: No chest wall abnormality. No acute or significant osseous findings. Review of the MIP images confirms the above findings. CTA ABDOMEN AND PELVIS FINDINGS VASCULAR Aorta: Focal ectasia of the infrarenal abdominal aorta measuring 2.1 cm. No aneurysm, dissection, vasculitis or significant stenosis. Mild calcified and noncalcified atherosclerosis. Celiac: Patent without evidence of  aneurysm, dissection, vasculitis or significant stenosis. Median arcuate configuration. SMA: Patent without evidence of aneurysm, dissection, vasculitis or significant stenosis. Renals: Both renal arteries are patent without evidence of aneurysm, dissection, vasculitis, fibromuscular dysplasia or significant stenosis. IMA: Patent without evidence of aneurysm, dissection, vasculitis or significant stenosis. 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 No aneurysm. Remaining inflow vessels are patent without evidence of aneurysm, dissection, vasculitis or significant stenosis. Veins: No obvious venous abnormality within the limitations of this arterial phase study. Review of the MIP images confirms the above findings. NON-VASCULAR Hepatobiliary: Subcentimeter low-density lesion in segment 8 is too small to characterize. Cholelithiasis. No biliary dilatation. Pancreas: Unremarkable. No pancreatic ductal dilatation or surrounding inflammatory changes. Spleen: Normal in size without focal abnormality. Adrenals/Urinary Tract: Adrenal glands are unremarkable. Kidneys are normal, without renal calculi, focal lesion, or hydronephrosis. Bladder is unremarkable. Stomach/Bowel: Stomach is within normal limits. Appendix appears normal. No evidence of bowel wall thickening, distention, or inflammatory changes. Lymphatic: No significant vascular findings are present. No enlarged abdominal or pelvic lymph nodes. Reproductive: Prostate is unremarkable. Other: Small fat containing umbilical hernia. No pneumoperitoneum or free fluid. Musculoskeletal: No acute or significant osseous findings. Degenerative disc disease at L5-S1. Review of the MIP images confirms the above findings. IMPRESSION: 1. No evidence of thoracic or abdominal aortic dissection. 2. Mild focal ectasia of the infrarenal abdominal aorta, measuring up to 2.1 cm. 3. Small focal dissection of the right common iliac  artery without stenosis or aneurysm. 4.  Aortic atherosclerosis (ICD10-I70.0). 5. Cholelithiasis. Electronically Signed   By: Obie Dredge M.D.   On: 01/22/2018 13:59    Cardiac Studies   [facility] (previous admission, 01/07/18) Conclusion     Prox RCA lesion is 85% stenosed.  The left ventricular systolic function is normal.  LV end diastolic pressure is normal.  The left ventricular ejection fraction is 55-65% by visual estimate.  Post intervention, there is a 0% residual stenosis.  A drug-eluting stent was successfully placed using a STENT SYNERGY DES 3.5X16.  A drug-eluting stent was successfully placed using a STENT SYNERGY DES 3.5X8.   1. Single vessel obstructive CAD 2. Normal LV function 3. Normal LVEDP 4. Successful stenting of the proximal RCA with DES x 2.  Plan: DAPT for one year. Anticipate DC in am. Risk factor modification.     Patient Profile   52 y/o male who recently presented to [facility] 01/07/18 with SSCP which started at work. His Troponin were negative but his symptoms classic for [place]. Cath revealed 85% pRCA which was stented with a DES. He had no other lesions noted.  He was seen in the office today and complained of severe SSCP/ indigestion, unrelieved with nitro and antacids. He was hypertensive in the office and was sent to the [facility] ED to r/o dissection and MI.   Assessment & Plan    1. Chest Pain: Chest CT negative for dissection and PE. EKG unchanged from previous and w/o ST changes. Lipase  is negative. Initial troponin is negative. We will check a 2nd troponin. If normal, then acute in stent thrombosis is unlikely and the patient can be discharged. Pt reports he is feeling better and wants to go home.   2. CAD: s/p recent pRCA stent 01/07/18. No other disease noted on cath. He had recurrent CP earlier this am, in the setting of hypertensive urgency. Chest CT negative for dissection and PE. EKG nonischemic. Initial troponin is negative. 2nd troponin  pending. He is feeling better. Suspect his elevated BP may have contributed to symptoms. We will try adding Imdur to his regimen. Continue ASA, Brilinta, and statin. No BB given borderline bradycardia.   3. HTN: improved currently in the ED. Given CAD and chest pain, we will add Imdur to his regimen.   For questions or updates, please contact [facility] Please consult [email or web address] for contact info under Cardiology/STEMI.      Signed, Robbie LisBrittainy Simmons, PA-C  01/22/2018, 3:27 PM    Personally seen and examined. Agree with above.  52 year old male with recent proximal RCA stent on 01/07/18 with chest discomfort.  His chest pain has subsided.  We discussed continuation of observation with continued serial troponins however he would like to go home.  He understands the risks.  We will check another troponin.  As long as this is negative, he may go home from the emergency department.  His EKG does not show any evidence of ST elevation.  There is subtle J-point elevation in V2 V3 which was there previously.  The emergency room performed a CT scan to exclude dissection.  There is a small focal right common iliac dissection just after the bifurcation.  No intervention is needed. Would recommend duplex in 6 months to ensure no aneurysmal development.   On exam, pleasant, laying in bed, overweight, abdomen soft, heart regular rate and rhythm, no murmurs rubs or gallops, normal distal pulses.  -We will add isosorbide to his regimen.- Stressed the importance of dual antiplatelet therapy.  He states that the Brilinta may be too expensive for him.  If it is, he may need to be switched to Plavix.  He knows to call our office.  Donato SchultzMark Skains, MD

## 2018-01-22 NOTE — Assessment & Plan Note (Signed)
[place] 01/07/18- Cath showed single vessel disease-RCA PCI with DES

## 2018-01-22 NOTE — ED Triage Notes (Signed)
[facility]- pt coming from cardiology office with complaint of substernal chest pain and epigastric pain. Pt was diaphoretic at cards office and one episode of vomiting. Pt had cardiac cath 2 weeks ago. Pt took tums with no relief. Pt was given 4mg  of zofran, 324 of aspirin, 0.4mg  of nitro at cardiologist, and 8mg  of morphine.

## 2018-01-22 NOTE — H&P (Addendum)
Cardiology Admission History and Physical:   Patient ID: Rodney Rangel; MRN: [id number]; DOB: 01-20-1966   Admission date: 01/22/2018  Primary Care Provider: Deatra James, MD Primary Cardiologist: Rollene Rotunda, MD 2 Chief Complaint:  Chest apin  Patient Profile:   Rodney Rangel is a 52 y.o. male who recently had an MI-PCI.  History of Present Illness:   Rodney Rangel presented to [facility] 01/07/18 with SSCP which started at work. His Troponin were negative but his symptoms classic for [place]. Cath revealed 85% pRCA which was stented with a DES. He had no other lesions noted. He say he has done well since discharge and was hoping to get released to go back to work. This morning he developed SSCP "indigestion" about an hour before this visit. He took "10 TUMS" without relief. In the office he is visably uncomfortable. He did get better with SL NTG x1. He did become clammy and and had nausea and vomiting in the office. He thinks its a little different than his MI pain. It does radiate to his back. His EKG in the office showed no acute changes.    Past Medical History:  Diagnosis Date  . Coronary artery disease    3/19 PCI/DESx2 to pRCA  . Hyperlipidemia   . Pre-diabetes     Past Surgical History:  Procedure Laterality Date  . BACK SURGERY    . CARDIAC CATHETERIZATION  01/07/2018  . CORONARY STENT INTERVENTION N/A 01/07/2018   Procedure: CORONARY STENT INTERVENTION;  Surgeon: Swaziland, Peter M, MD;  Location: [facility] INVASIVE CV LAB;  Service: Cardiovascular;  Laterality: N/A;  . CORONARY STENT PLACEMENT  01/07/2018  . LEFT HEART CATH AND CORONARY ANGIOGRAPHY N/A 01/07/2018   Procedure: LEFT HEART CATH AND CORONARY ANGIOGRAPHY;  Surgeon: Swaziland, Peter M, MD;  Location: [facility] INVASIVE CV LAB;  Service: Cardiovascular;  Laterality: N/A;  . NECK SURGERY       Medications Prior to Admission: Prior to Admission medications   Medication Sig Start Date End Date Taking? Authorizing Provider  aspirin 81  MG chewable tablet Chew 1 tablet (81 mg total) by mouth daily. 01/09/18   Arty Baumgartner, NP  gabapentin (NEURONTIN) 800 MG tablet Take 800 mg by mouth daily. 11/20/17   [provider]  nitroGLYCERIN (NITROSTAT) 0.4 MG SL tablet Place 1 tablet (0.4 mg total) under the tongue every 5 (five) minutes as needed. 01/08/18   Arty Baumgartner, NP  PARoxetine (PAXIL) 40 MG tablet Take 40 mg by mouth daily. 11/20/17   [provider]  rosuvastatin (CRESTOR) 40 MG tablet Take 40 mg by mouth daily.    [provider]  ticagrelor (BRILINTA) 90 MG TABS tablet Take 1 tablet (90 mg total) by mouth 2 (two) times daily. 01/08/18   Arty Baumgartner, NP     Allergies:   No Known Allergies  Social History:   Social History   Socioeconomic History  . Marital status: Married    Spouse name: Not on file  . Number of children: Not on file  . Years of education: Not on file  . Highest education level: Not on file  Occupational History  . Not on file  Social Needs  . Financial resource strain: Not on file  . Food insecurity:    Worry: Not on file    Inability: Not on file  . Transportation needs:    Medical: Not on file    Non-medical: Not on file  Tobacco Use  . Smoking status: Current  Some Day Smoker  . Smokeless tobacco: Never Used  Substance and Sexual Activity  . Alcohol use: No  . Drug use: No  . Sexual activity: Not on file  Lifestyle  . Physical activity:    Days per week: Not on file    Minutes per session: Not on file  . Stress: Not on file  Relationships  . Social connections:    Talks on phone: Not on file    Gets together: Not on file    Attends religious service: Not on file    Active member of club or organization: Not on file    Attends meetings of clubs or organizations: Not on file    Relationship status: Not on file  . Intimate partner violence:    Fear of current or ex partner: Not on file    Emotionally abused: Not on file    Physically  abused: Not on file    Forced sexual activity: Not on file  Other Topics Concern  . Not on file  Social History Narrative  . Not on file    Family History:   The patient's family history includes CAD in his father; Lung cancer in his father.    ROS:  Please see the history of present illness.  All other ROS reviewed and negative.     Physical Exam/Data:  There were no vitals filed for this visit. No intake or output data in the 24 hours ending 01/22/18 0929 There were no vitals filed for this visit. There is no height or weight on file to calculate BMI.  General:  Well nourished, well developed, in mild distress-c/o chest and epigastric pain HEENT: normal Lymph: no adenopathy Neck: no JVD Endocrine:  No thryomegaly Vascular: No carotid bruits; FA pulses 2+ bilaterally without bruits  Cardiac:  normal S1, S2; RRR; no murmur  Lungs:  clear to auscultation bilaterally, no wheezing, rhonchi or rales  Abd: soft, nontender, no hepatomegaly  Ext: no edema Musculoskeletal:  No deformities, BUE and BLE strength normal and equal Skin: cool, clammy  Neuro:  CNs 2-12 intact, no focal abnormalities noted Psych:  Normal affect    EKG:  The ECG that was done was personally reviewed and demonstrates NSR-no acute changes  Laboratory Data:  ChemistryNo results for input(s): NA, K, CL, CO2, GLUCOSE, BUN, CREATININE, CALCIUM, GFRNONAA, GFRAA, ANIONGAP in the last 168 hours.  No results for input(s): PROT, ALBUMIN, AST, ALT, ALKPHOS, BILITOT in the last 168 hours. HematologyNo results for input(s): WBC, RBC, HGB, HCT, MCV, MCH, MCHC, RDW, PLT in the last 168 hours. Cardiac EnzymesNo results for input(s): TROPONINI in the last 168 hours. No results for input(s): TROPIPOC in the last 168 hours.  BNPNo results for input(s): BNP, PROBNP in the last 168 hours.  DDimer No results for input(s): DDIMER in the last 168 hours.  Radiology/Studies:  No results found.  Assessment and Plan:    Unstable angina (HCC) Pt presented to the office for post MI f/u and had chest pain, nausea, vomiting-transferred by EMS to [facility]  CAD S/P percutaneous coronary angioplasty [place] 01/07/18- Cath showed single vessel disease-RCA PCI with DES  Dyslipidemia LDL 86 March 2019- high dose statin added  Family history of premature CAD Father had an MI  Former smoker Not smoking  Plan: Pt seen by Dr Jens Som and myself in the office. Plan to send to the ED via EMS. R/O MI, consider Chest CT to r/o dissection if his Troponin is negative.  Severity of Illness: The appropriate patient status for this patient is OBSERVATION. Observation status is judged to be reasonable and necessary in order to provide the required intensity of service to ensure the patient's safety. The patient's presenting symptoms, physical exam findings, and initial radiographic and laboratory data in the context of their medical condition is felt to place them at decreased risk for further clinical deterioration. Furthermore, it is anticipated that the patient will be medically stable for discharge from the hospital within 2 midnights of admission. The following factors support the patient status of observation.   " The patient's presenting symptoms include chest pain. " The physical exam findings include normal findings. " The initial radiographic and laboratory data are normal.     For questions or updates, please contact [facility] Please consult [email or web address] for contact info under Cardiology/STEMI.    Signed, Corine ShelterLuke Kilroy, PA-C  01/22/2018 9:29 AM   As above, patient seen and examined.  Briefly he is a 52 year old male with past medical history of coronary artery disease status post PCI of RCA, hyperlipidemia, bipolar disorder with chest pain.  Patient underwent recent PCI of his right coronary artery.  He has done well until this morning when he developed substernal/epigastric chest pain.  He describes it as  "indigestion" radiating to his back.  There was nausea and diaphoresis but no dyspnea.  The pain is not pleuritic or positional and he states unlike his prior cardiac pain.  Patient took Tums at home without relief.  In the office he is uncomfortable.  On exam he states palpating his sternal area actually improves his symptoms.  His lungs are clear and he has 2+ carotid, radial and femoral pulses.  Electrocardiogram shows sinus rhythm with no ST changes. Etiology of pain unclear.  He is significantly hypertensive in the office with blood pressure of 180/120.  We will arrange a CTA to evaluate his thoracic and abdominal aorta although I think dissection is less likely.  His electrocardiogram shows sinus rhythm with no ST changes.  We will cycle enzymes.  He states this is unlike his previous cardiac pain.  Would also check liver functions, amylase and lipase though no right upper quadrant pain on palpation.  Olga MillersBrian Crenshaw, MD

## 2018-01-22 NOTE — Patient Instructions (Signed)
Patient sent [facility] via EMS for active chest pain.

## 2018-01-22 NOTE — Discharge Instructions (Addendum)
1.  Continue your medications as discussed with your cardiologist.  Start taking Imdur daily as prescribed. 2.  Follow gallbladder diet.  This is no fats.  Take an appointment [facility] for further evaluation of your gallbladder. 3.  Make an appointment with your cardiologist for continued monitoring of your blood pressure and heart disease.

## 2018-01-22 NOTE — Progress Notes (Signed)
01/22/2018 Rodney Rangel   1965-11-27  [id number]  Primary Physician Deatra JamesSun, Vyvyan, MD Primary Cardiologist: Dr Allyson SabalBerry  HPI:  52 y/o [facility] with SSCP which started at work. His Troponin were negative but his symptoms classic for [place]. Cath revealed 85% pRCA which was stented with a DES. He had no other lesions noted. He say he has done well since discharge and was hoping to get released to go back to work. This morning he developed SSCP "indigestion" about an hour before this visit. He took TUMS without relief. In the office he is visably uncomfortable. He did get better with SL NTG x1. He did become clammy and and had nausea and vomiting in the office. He thinks its a little different than his MI pain. It does radiate to his back. His EKG in the office showed no acute changes.    Current Outpatient Medications  Medication Sig Dispense Refill  . aspirin 81 MG chewable tablet Chew 1 tablet (81 mg total) by mouth daily.    Marland Kitchen. gabapentin (NEURONTIN) 800 MG tablet Take 800 mg by mouth daily.    . nitroGLYCERIN (NITROSTAT) 0.4 MG SL tablet Place 1 tablet (0.4 mg total) under the tongue every 5 (five) minutes as needed. 25 tablet 2  . PARoxetine (PAXIL) 40 MG tablet Take 40 mg by mouth daily.    . rosuvastatin (CRESTOR) 40 MG tablet Take 40 mg by mouth daily.    . ticagrelor (BRILINTA) 90 MG TABS tablet Take 1 tablet (90 mg total) by mouth 2 (two) times daily. 180 tablet 2   No current facility-administered medications for this visit.     No Known Allergies  Past Medical History:  Diagnosis Date  . Coronary artery disease    3/19 PCI/DESx2 to pRCA  . Hyperlipidemia   . Pre-diabetes     Social History   Socioeconomic History  . Marital status: Married    Spouse name: Not on file  . Number of children: Not on file  . Years of education: Not on file  . Highest education level: Not on file  Occupational History  . Not on file  Social Needs  . Financial  resource strain: Not on file  . Food insecurity:    Worry: Not on file    Inability: Not on file  . Transportation needs:    Medical: Not on file    Non-medical: Not on file  Tobacco Use  . Smoking status: Current Some Day Smoker  . Smokeless tobacco: Never Used  Substance and Sexual Activity  . Alcohol use: No  . Drug use: No  . Sexual activity: Not on file  Lifestyle  . Physical activity:    Days per week: Not on file    Minutes per session: Not on file  . Stress: Not on file  Relationships  . Social connections:    Talks on phone: Not on file    Gets together: Not on file    Attends religious service: Not on file    Active member of club or organization: Not on file    Attends meetings of clubs or organizations: Not on file    Relationship status: Not on file  . Intimate partner violence:    Fear of current or ex partner: Not on file    Emotionally abused: Not on file    Physically abused: Not on file    Forced sexual activity: Not on file  Other Topics Concern  .  Not on file  Social History Narrative  . Not on file     Family History  Problem Relation Age of Onset  . CAD Father   . Lung cancer Father      Review of Systems: General: negative for chills, fever, night sweats or weight changes.  Cardiovascular: negative for chest pain, dyspnea on exertion, edema, orthopnea, palpitations, paroxysmal nocturnal dyspnea or shortness of breath Dermatological: negative for rash Respiratory: negative for cough or wheezing Urologic: negative for hematuria Abdominal: negative for nausea, vomiting, diarrhea, bright red blood per rectum, melena, or hematemesis Neurologic: negative for visual changes, syncope, or dizziness All other systems reviewed and are otherwise negative except as noted above.    Blood pressure 118/72, pulse 73.  General appearance: alert, cooperative and mild distress Neck: no carotid bruit and no JVD Lungs: clear to auscultation  bilaterally Heart: regular rate and rhythm Abdomen: soft, non-tender; bowel sounds normal; no masses,  no organomegaly Extremities: extremities normal, atraumatic, no cyanosis or edema Pulses: 2+ and symmetric Skin: Skin color, texture, turgor normal. No rashes or lesions Neurologic: Grossly normal  EKG NSR- no acute changes  ASSESSMENT AND PLAN:   Unstable angina (HCC) Pt presented to the office for post MI f/u and had chest pain, nausea, vomiting-transferred by EMS to [facility]  CAD S/P percutaneous coronary angioplasty [place] 01/07/18- Cath showed single vessel disease-RCA PCI with DES  Dyslipidemia LDL 86 March 2019- high dose statin added  Family history of premature CAD Father had an MI  Former smoker Not smoking   PLAN  Pt seen by Dr Antoine Poche- plan to send to [facility] via EMS, r/o MI, consider chest CTA to r/o dissection.   Corine Shelter PA-C 01/22/2018 8:41 AM

## 2018-01-22 NOTE — Assessment & Plan Note (Signed)
Not smoking.  

## 2018-01-26 ENCOUNTER — Telehealth (HOSPITAL_COMMUNITY): Payer: Self-pay

## 2018-01-26 NOTE — Telephone Encounter (Signed)
2nd attempt to call patient in regards to Cardiac Rehab - lm on vm. Sending letter. °

## 2018-02-01 ENCOUNTER — Encounter: Payer: Self-pay | Admitting: Cardiology

## 2018-02-01 ENCOUNTER — Telehealth: Payer: Self-pay | Admitting: Cardiology

## 2018-02-01 NOTE — Telephone Encounter (Signed)
Pt needed a return to work letter which I provided.  Corine ShelterLUKE Aqil Goetting PA-C 02/01/2018 1:31 PM

## 2018-02-02 ENCOUNTER — Telehealth (HOSPITAL_COMMUNITY): Payer: Self-pay

## 2018-02-02 NOTE — Telephone Encounter (Signed)
3rd attempt to call patient in regards to cardiac rehab - lm on vm °

## 2018-02-17 ENCOUNTER — Encounter: Payer: Self-pay | Admitting: Cardiology

## 2018-02-17 ENCOUNTER — Ambulatory Visit (INDEPENDENT_AMBULATORY_CARE_PROVIDER_SITE_OTHER): Payer: 59 | Admitting: Cardiology

## 2018-02-17 VITALS — BP 126/72 | HR 60 | Ht 73.0 in | Wt 229.0 lb

## 2018-02-17 DIAGNOSIS — I2510 Atherosclerotic heart disease of native coronary artery without angina pectoris: Secondary | ICD-10-CM | POA: Diagnosis not present

## 2018-02-17 DIAGNOSIS — E785 Hyperlipidemia, unspecified: Secondary | ICD-10-CM

## 2018-02-17 DIAGNOSIS — Z8249 Family history of ischemic heart disease and other diseases of the circulatory system: Secondary | ICD-10-CM

## 2018-02-17 DIAGNOSIS — R0789 Other chest pain: Secondary | ICD-10-CM | POA: Diagnosis not present

## 2018-02-17 DIAGNOSIS — Z87891 Personal history of nicotine dependence: Secondary | ICD-10-CM | POA: Diagnosis not present

## 2018-02-17 DIAGNOSIS — Z9861 Coronary angioplasty status: Secondary | ICD-10-CM | POA: Diagnosis not present

## 2018-02-17 MED ORDER — ISOSORBIDE MONONITRATE ER 30 MG PO TB24: 30.0000 mg | ORAL_TABLET | Freq: Every day | ORAL | 3 refills | Status: DC

## 2018-02-17 MED ORDER — ROSUVASTATIN CALCIUM 40 MG PO TABS: 40.0000 mg | ORAL_TABLET | Freq: Every day | ORAL | 3 refills | Status: AC

## 2018-02-17 NOTE — Patient Instructions (Signed)
Medication Instructions: Your physician recommends that you continue on your current medications as directed. Please refer to the Current Medication list given to you today.  Labwork: Your physician recommends that you return for a FASTING lipid profile and CMET in 1-2 weeks at your earliest convenience.   Follow-Up: Your physician wants you to follow-up in: 6 months with Dr. Allyson SabalBerry. You will receive a reminder letter in the mail two months in advance. If you don't receive a letter, please call our office to schedule the follow-up appointment.  If you need a refill on your cardiac medications before your next appointment, please call your pharmacy.

## 2018-02-17 NOTE — Progress Notes (Signed)
02/17/2018 Rodney Rangel   04-02-1966  [id number]  Primary Physician Deatra JamesSun, Vyvyan, MD Primary Cardiologist: Dr Allyson SabalBerry  HPI:  52 [facility] with SSCP which started at work. His Troponin were negative but his symptoms classic for [place]. Cath revealed an 85% pRCA which was stented with a DES. He had no other lesions noted. He was seen in the office in follow up 01/22/18 and complained of SSCP "indigestion" about an hour before that visit. He took TUMS without relief. In the office he is visably uncomfortable. He did get better with SL NTG x1. He did become clammy and and had nausea and vomiting in the office. His EKG in the office showed no acute changes. [facility]. His Troponin again were WNL. Imdur was added and it was felt he could be discharged later that same day.   He is in the office today for follow up. He says he has done well since, no further chest pain. He is not smoking but he is vaping. He is back to work and has a strenuous job, "I walk 5 miles a day". He has not had chest pain. His PCP has placed him on a diabetic diet- HGb A1c- 6.2.     Current Outpatient Medications  Medication Sig Dispense Refill  . aspirin 81 MG chewable tablet Chew 1 tablet (81 mg total) by mouth daily.    Marland Kitchen. gabapentin (NEURONTIN) 800 MG tablet Take 800 mg by mouth daily.    . nitroGLYCERIN (NITROSTAT) 0.4 MG SL tablet Place 1 tablet (0.4 mg total) under the tongue every 5 (five) minutes as needed. 25 tablet 2  . omeprazole (PRILOSEC) 20 MG capsule Take 1 capsule (20 mg total) by mouth daily. 30 capsule 0  . oxyCODONE-acetaminophen (PERCOCET) 5-325 MG tablet Take 1-2 tablets by mouth every 6 (six) hours as needed. 20 tablet 0  . PARoxetine (PAXIL) 40 MG tablet Take 40 mg by mouth daily.    . ticagrelor (BRILINTA) 90 MG TABS tablet Take 1 tablet (90 mg total) by mouth 2 (two) times daily. 180 tablet 2  . isosorbide mononitrate (IMDUR) 30 MG 24 hr tablet Take 1 tablet (30  mg total) by mouth daily. 90 tablet 3  . rosuvastatin (CRESTOR) 40 MG tablet Take 1 tablet (40 mg total) by mouth daily. 90 tablet 3   Current Facility-Administered Medications  Medication Dose Route Frequency Provider Last Rate Last Dose  . nitroGLYCERIN (NITROSTAT) SL tablet 0.4 mg  0.4 mg Sublingual Q5 min PRN Corine ShelterKilroy, Riely Oetken K, PA-C        No Known Allergies  Past Medical History:  Diagnosis Date  . Coronary artery disease    3/19 PCI/DESx2 to pRCA  . Hyperlipidemia   . Pre-diabetes     Social History   Socioeconomic History  . Marital status: Married    Spouse name: Not on file  . Number of children: Not on file  . Years of education: Not on file  . Highest education level: Not on file  Occupational History  . Not on file  Social Needs  . Financial resource strain: Not on file  . Food insecurity:    Worry: Not on file    Inability: Not on file  . Transportation needs:    Medical: Not on file    Non-medical: Not on file  Tobacco Use  . Smoking status: Current Some Day Smoker  . Smokeless tobacco: Never Used  Substance  and Sexual Activity  . Alcohol use: No  . Drug use: No  . Sexual activity: Not on file  Lifestyle  . Physical activity:    Days per week: Not on file    Minutes per session: Not on file  . Stress: Not on file  Relationships  . Social connections:    Talks on phone: Not on file    Gets together: Not on file    Attends religious service: Not on file    Active member of club or organization: Not on file    Attends meetings of clubs or organizations: Not on file    Relationship status: Not on file  . Intimate partner violence:    Fear of current or ex partner: Not on file    Emotionally abused: Not on file    Physically abused: Not on file    Forced sexual activity: Not on file  Other Topics Concern  . Not on file  Social History Narrative  . Not on file     Family History  Problem Relation Age of Onset  . CAD Father   . Lung cancer  Father      Review of Systems: General: negative for chills, fever, night sweats or weight changes.  Cardiovascular: negative for chest pain, dyspnea on exertion, edema, orthopnea, palpitations, paroxysmal nocturnal dyspnea or shortness of breath Dermatological: negative for rash Respiratory: negative for cough or wheezing Urologic: negative for hematuria Abdominal: negative for nausea, vomiting, diarrhea, bright red blood per rectum, melena, or hematemesis Neurologic: negative for visual changes, syncope, or dizziness All other systems reviewed and are otherwise negative except as noted above.    Blood pressure 126/72, pulse 60, height 6\' 1"  (1.854 m), weight 229 lb (103.9 kg).  General appearance: alert, cooperative, no distress and mildly obese Neck: no carotid bruit and no JVD Lungs: clear to auscultation bilaterally Heart: regular rate and rhythm Extremities: extremities normal, atraumatic, no cyanosis or edema Skin: Skin color, texture, turgor normal. No rashes or lesions Neurologic: Grossly normal   ASSESSMENT AND PLAN:   CAD S/P percutaneous coronary angioplasty [place] 01/07/18- Cath showed single vessel disease-RCA PCI with DES Recurrent chest prompting <24 hour admission 01/22/18- ? Spasm. Symptoms improved with Imdur.  Dyslipidemia LDL 86 March 2019- high dose statin added  Family history of premature CAD Father had an MI  Former smoker Not smoking but he is vaping   PLAN  Same Rx. We discussed vaping and the ultimate goal of discontinuing this as well.   Corine Shelter PA-C 02/17/2018 1:39 PM

## 2018-04-06 ENCOUNTER — Telehealth: Payer: Self-pay | Admitting: Cardiovascular Disease

## 2018-04-06 MED ORDER — TICAGRELOR 90 MG PO TABS: 90.0000 mg | ORAL_TABLET | Freq: Two times a day (BID) | ORAL | 2 refills | Status: DC

## 2018-04-06 NOTE — Telephone Encounter (Signed)
New Message:       Pt is calling on the status of [facility]

## 2018-04-06 NOTE — Telephone Encounter (Signed)
New message    Has been out since last week  *STAT* If patient is at the pharmacy, call can be transferred to refill team.   1. Which medications need to be refilled? (please list name of each medication and dose if known)  ticagrelor (BRILINTA) 90 MG TABS tablet Take 1 tablet (90 mg total) by mouth 2 (two) times daily.         2. Which pharmacy/location (including street and city if local pharmacy) is medication to be sent to? [facility] [address]  3. Do they need a 30 day or 90 day supply?  90

## 2018-04-06 NOTE — Telephone Encounter (Signed)
Prescription e sent to [facility]

## 2018-04-06 NOTE — Telephone Encounter (Signed)
E sent to pharmacy 

## 2018-04-07 ENCOUNTER — Other Ambulatory Visit: Payer: Self-pay

## 2018-05-17 DIAGNOSIS — E785 Hyperlipidemia, unspecified: Secondary | ICD-10-CM | POA: Diagnosis not present

## 2018-05-17 DIAGNOSIS — E119 Type 2 diabetes mellitus without complications: Secondary | ICD-10-CM | POA: Diagnosis not present

## 2018-08-18 ENCOUNTER — Other Ambulatory Visit: Payer: Self-pay

## 2018-08-18 MED ORDER — TICAGRELOR 90 MG PO TABS: 90.0000 mg | ORAL_TABLET | Freq: Two times a day (BID) | ORAL | 2 refills | Status: DC

## 2018-09-22 DIAGNOSIS — R0789 Other chest pain: Secondary | ICD-10-CM | POA: Diagnosis not present

## 2018-09-22 DIAGNOSIS — R0602 Shortness of breath: Secondary | ICD-10-CM | POA: Diagnosis not present

## 2018-09-22 DIAGNOSIS — R079 Chest pain, unspecified: Secondary | ICD-10-CM | POA: Diagnosis not present

## 2018-11-08 ENCOUNTER — Telehealth: Payer: Self-pay | Admitting: Cardiology

## 2018-11-08 NOTE — Telephone Encounter (Signed)
Returned pt call.[email or web address] 

## 2018-11-08 NOTE — Telephone Encounter (Signed)
New Message:    Patient calling he is having some concerns about his blood thinners. Please call patient back.

## 2018-11-11 NOTE — Telephone Encounter (Signed)
LEFT MESSAGE TO CALL BACK  TODAY 11/11/18 , IF NO RESPONSE WILL CLOSE ENCOUTER

## 2018-11-11 NOTE — Telephone Encounter (Signed)
Encounter closed

## 2018-11-16 ENCOUNTER — Telehealth: Payer: Self-pay | Admitting: Cardiology

## 2018-11-16 NOTE — Telephone Encounter (Signed)
Called patient, LVM to discuss.  Advised I would have to discuss with Dr.Hochrein before stopping that medication.   Please advise, will route to MD, and nurse.

## 2018-11-16 NOTE — Telephone Encounter (Signed)
New message      Pt c/o medication issue:  1. Name of Medication:   ticagrelor (BRILINTA) 90 MG TABS tablet     2. How are you currently taking this medication (dosage and times per day)? 1 tab a day   3. Are you having a reaction (difficulty breathing--STAT)? no  4. What is your medication issue? Pt wants to be off medication . He was advised that it was not suppose to be an on going thing.

## 2018-11-16 NOTE — Telephone Encounter (Signed)
We usually continue it for one year so the plan would be to stop the Brilinta in March.

## 2018-11-18 NOTE — Telephone Encounter (Signed)
Called and leave detailed message(DPR) letting pt know Dr Antoine Poche will like for him to continue taking Brilinta until March and he can give our office a call if her need any samples to get him thru until march.

## 2019-07-28 ENCOUNTER — Encounter (HOSPITAL_COMMUNITY): Payer: Self-pay

## 2019-07-28 ENCOUNTER — Other Ambulatory Visit: Payer: Self-pay

## 2019-07-28 ENCOUNTER — Emergency Department (HOSPITAL_COMMUNITY): Payer: 59

## 2019-07-28 ENCOUNTER — Telehealth: Payer: Self-pay | Admitting: Cardiology

## 2019-07-28 ENCOUNTER — Emergency Department (HOSPITAL_COMMUNITY)
Admission: EM | Admit: 2019-07-28 | Discharge: 2019-07-29 | Disposition: A | Discharge status: Home / Self Care | Payer: 59 | Attending: Emergency Medicine | Admitting: Emergency Medicine

## 2019-07-28 DIAGNOSIS — I77819 Aortic ectasia, unspecified site: Secondary | ICD-10-CM | POA: Insufficient documentation

## 2019-07-28 DIAGNOSIS — R1031 Right lower quadrant pain: Secondary | ICD-10-CM | POA: Insufficient documentation

## 2019-07-28 DIAGNOSIS — F17200 Nicotine dependence, unspecified, uncomplicated: Secondary | ICD-10-CM | POA: Insufficient documentation

## 2019-07-28 LAB — CBC
HCT: 52 % (ref 39.0–52.0)
Hemoglobin: 16.2 g/dL (ref 13.0–17.0)
MCH: 29.8 pg (ref 26.0–34.0)
MCHC: 31.2 g/dL (ref 30.0–36.0)
MCV: 95.8 fL (ref 80.0–100.0)
Platelets: 240 10*3/uL (ref 150–400)
RBC: 5.43 MIL/uL (ref 4.22–5.81)
RDW: 13.6 % (ref 11.5–15.5)
WBC: 13 10*3/uL — ABNORMAL HIGH (ref 4.0–10.5)
nRBC: 0 % (ref 0.0–0.2)

## 2019-07-28 LAB — COMPREHENSIVE METABOLIC PANEL
ALT: 24 U/L (ref 0–44)
AST: 17 U/L (ref 15–41)
Albumin: 4.5 g/dL (ref 3.5–5.0)
Alkaline Phosphatase: 80 U/L (ref 38–126)
Anion gap: 14 (ref 5–15)
BUN: 32 mg/dL — ABNORMAL HIGH (ref 6–20)
CO2: 21 mmol/L — ABNORMAL LOW (ref 22–32)
Calcium: 9.6 mg/dL (ref 8.9–10.3)
Chloride: 101 mmol/L (ref 98–111)
Creatinine, Ser: 2.04 mg/dL — ABNORMAL HIGH (ref 0.61–1.24)
GFR calc Af Amer: 42 mL/min — ABNORMAL LOW (ref >60)
GFR calc non Af Amer: 36 mL/min — ABNORMAL LOW (ref >60)
Glucose, Bld: 112 mg/dL — ABNORMAL HIGH (ref 70–99)
Potassium: 4.2 mmol/L (ref 3.5–5.1)
Sodium: 136 mmol/L (ref 135–145)
Total Bilirubin: 0.5 mg/dL (ref 0.3–1.2)
Total Protein: 7.4 g/dL (ref 6.5–8.1)

## 2019-07-28 LAB — LACTIC ACID, PLASMA: Lactic Acid, Venous: 0.8 mmol/L (ref 0.5–1.9)

## 2019-07-28 LAB — LIPASE, BLOOD: Lipase: 31 U/L (ref 11–51)

## 2019-07-28 MED ORDER — SODIUM CHLORIDE 0.9% FLUSH: 3.0000 mL | Freq: Once | INTRAVENOUS | Status: DC

## 2019-07-28 MED ORDER — SODIUM CHLORIDE 0.9 % IV BOLUS
1000.0000 mL | Freq: Once | INTRAVENOUS | Status: AC
  Administered 2019-07-28: 1000 mL via INTRAVENOUS

## 2019-07-28 MED ORDER — HYDROMORPHONE HCL 1 MG/ML IJ SOLN
1.0000 mg | Freq: Once | INTRAMUSCULAR | Status: AC
  Administered 2019-07-29: 1 mg via INTRAVENOUS
  Filled 2019-07-28: qty 1

## 2019-07-28 MED ORDER — HYDROMORPHONE HCL 1 MG/ML IJ SOLN
1.0000 mg | Freq: Once | INTRAMUSCULAR | Status: AC
Start: 2019-07-28 — End: 2019-07-28
  Administered 2019-07-28: 1 mg via INTRAVENOUS
  Filled 2019-07-28: qty 1

## 2019-07-28 MED ORDER — IOHEXOL 350 MG/ML SOLN
100.0000 mL | Freq: Once | INTRAVENOUS | Status: AC | PRN
  Administered 2019-07-28: 23:00:00 100 mL via INTRAVENOUS

## 2019-07-28 NOTE — ED Provider Notes (Signed)
[facility] Emergency Department Provider Note MRN:  [id number]  Arrival date & time: 07/28/19     Chief Complaint   Abdominal Cramping (RLQ)   History of Present Illness   Rodney Rangel is a 53 y.o. year-old male with a history of CAD status post stent placement presenting to the ED with chief complaint of abdominal pain.  Location: Right lower quadrant radiating to the right upper quadrant Duration: Intermittent for 1 year but much more severe and unrelenting 2 hours ago Onset: Sudden Timing: Constant Description: Sharp Severity: Severe, 10 out of 10 Exacerbating/Alleviating Factors: Worse when leaning forward Associated Symptoms: Diaphoresis, lightheadedness Pertinent Negatives: No chest pain, no shortness of breath, no vomiting, no diarrhea, no fever   Review of Systems  A complete 10 system review of systems was obtained and all systems are negative except as noted in the HPI and PMH.   Patient's Health History    Past Medical History:  Diagnosis Date  . Coronary artery disease    3/19 PCI/DESx2 to pRCA  . Hyperlipidemia   . Pre-diabetes     Past Surgical History:  Procedure Laterality Date  . BACK SURGERY    . CARDIAC CATHETERIZATION  01/07/2018  . CORONARY STENT INTERVENTION N/A 01/07/2018   Procedure: CORONARY STENT INTERVENTION;  Surgeon: Martinique, Peter M, MD;  Location: [facility];  Service: Cardiovascular;  Laterality: N/A;  . CORONARY STENT PLACEMENT  01/07/2018  . LEFT HEART CATH AND CORONARY ANGIOGRAPHY N/A 01/07/2018   Procedure: LEFT HEART CATH AND CORONARY ANGIOGRAPHY;  Surgeon: Martinique, Peter M, MD;  Location: [place] [facility];  Service: Cardiovascular;  Laterality: N/A;  . NECK SURGERY      Family History  Problem Relation Age of Onset  . CAD Father   . Lung cancer Father     Social History   Socioeconomic History  . Marital status: Married    Spouse name: Not on file  . Number of children: Not on file  .  Years of education: Not on file  . Highest education level: Not on file  Occupational History  . Not on file  Social Needs  . Financial resource strain: Not on file  . Food insecurity    Worry: Not on file    Inability: Not on file  . Transportation needs    Medical: Not on file    Non-medical: Not on file  Tobacco Use  . Smoking status: Current Some Day Smoker    Packs/day: 0.50    Types: Cigarettes  . Smokeless tobacco: Never Used  Substance and Sexual Activity  . Alcohol use: No  . Drug use: No  . Sexual activity: Not on file  Lifestyle  . Physical activity    Days per week: Not on file    Minutes per session: Not on file  . Stress: Not on file  Relationships  . Social Herbalist on phone: Not on file    Gets together: Not on file    Attends religious service: Not on file    Active member of club or organization: Not on file    Attends meetings of clubs or organizations: Not on file    Relationship status: Not on file  . Intimate partner violence    Fear of current or ex partner: Not on file    Emotionally abused: Not on file    Physically abused: Not on file    Forced sexual activity: Not on file  Other Topics Concern  . Not on file  Social History Narrative  . Not on file     Physical Exam  Vital Signs and Nursing Notes reviewed Vitals:   07/28/19 2010 07/28/19 2200  BP: 120/85 109/77  Pulse: 88 74  Resp: 17 16  Temp: 98.6 F (37 C)   SpO2: 97% 98%    CONSTITUTIONAL: Well-appearing, in moderate distress due to pain NEURO:  Alert and oriented x 3, no focal deficits EYES:  eyes equal and reactive ENT/NECK:  no LAD, no JVD CARDIO: Regular rate, well-perfused, normal S1 and S2 PULM:  CTAB no wheezing or rhonchi GI/GU:  normal bowel sounds, non-distended, mild tenderness palpation to the right lower quadrant MSK/SPINE:  No gross deformities, no edema SKIN:  no rash, atraumatic PSYCH:  Appropriate speech and behavior  Diagnostic and  Interventional Summary    EKG Interpretation  Date/Time:    Ventricular Rate:    PR Interval:    QRS Duration:   QT Interval:    QTC Calculation:   R Axis:     Text Interpretation:        Labs Reviewed  COMPREHENSIVE METABOLIC PANEL - Abnormal; Notable for the following components:      Result Value   CO2 21 (*)    Glucose, Bld 112 (*)    BUN 32 (*)    Creatinine, Ser 2.04 (*)    GFR calc non Af Amer 36 (*)    GFR calc Af Amer 42 (*)    All other components within normal limits  CBC - Abnormal; Notable for the following components:   WBC 13.0 (*)    All other components within normal limits  LIPASE, BLOOD  URINALYSIS, ROUTINE W REFLEX MICROSCOPIC  LACTIC ACID, PLASMA    CT Angio Abd/Pel w/ and/or w/o    (Results Pending)    Medications  sodium chloride flush (NS) 0.9 % injection 3 mL (3 mLs Intravenous Not Given 07/28/19 2204)  sodium chloride 0.9 % bolus 1,000 mL (has no administration in time range)  HYDROmorphone (DILAUDID) injection 1 mg (1 mg Intravenous Given 07/28/19 2219)  sodium chloride 0.9 % bolus 1,000 mL (1,000 mLs Intravenous New Bag/Given 07/28/19 2218)  iohexol (OMNIPAQUE) 350 MG/ML injection 100 mL (100 mLs Intravenous Contrast Given 07/28/19 2244)     Procedures Critical Care  ED Course and Medical Decision Making  I have reviewed the triage vital signs and the nursing notes.  Pertinent labs & imaging results that were available during my care of the patient were reviewed by me and considered in my medical decision making (see below for details).  Concern for pain out of proportion to physical exam in this 53 year old male with history of CAD and stent placement in the past, will obtain CTA to exclude vascular pathology such as mesenteric ischemia.  Also considering spigelian hernia, appendicitis.  No visible or palpable hernia on exam.  Work-up pending.  Awaiting CT imaging results, signed out to oncoming provider at shift change.  Elmer Sow. Pilar Plate,  MD [facility] Emergency Medicine [facility] [email or web address]  Final Clinical Impressions(s) / ED Diagnoses     ICD-10-CM   1. Right lower quadrant abdominal pain  R10.31     ED Discharge Orders    None      Discharge Instructions Discussed with and Provided to Patient: Discharge Instructions   None       Sabas Sous, MD 07/28/19 2313

## 2019-07-28 NOTE — ED Provider Notes (Signed)
Plan at signout is to f/u on urinalysis and reassess pain    Ripley Fraise, MD 07/28/19 2325

## 2019-07-28 NOTE — ED Triage Notes (Signed)
Pt reports RLQ pain that has occurred intermittently for about a year. Today pain was very sharp and pt says it "hurt so bad, almost past out"  Pt says he felt like a hard rock in his RLQ, pain has subsided now, but pt says nurse on call told him to come here to r/o appendicitis.

## 2019-07-28 NOTE — Telephone Encounter (Signed)
Patient called in reporting right abd pain. States has had several episodes over the past couple of days. Pain is so bad at times, he doubles over. Broke out in a sweat with the last episode. Says his abd gets hard when the pain is present. Currently sitting in a chair and not having pain, but afraid to move as he feels the pain will return. I advised if symptoms are this intense he should seek medical care at closest ED. Could be a number of things causing his symptoms. Denies any chest pain.

## 2019-07-29 ENCOUNTER — Telehealth: Payer: Self-pay

## 2019-07-29 LAB — URINALYSIS, ROUTINE W REFLEX MICROSCOPIC
Bilirubin Urine: NEGATIVE
Glucose, UA: NEGATIVE mg/dL
Hgb urine dipstick: NEGATIVE
Ketones, ur: 5 mg/dL — AB
Leukocytes,Ua: NEGATIVE
Nitrite: NEGATIVE
Protein, ur: NEGATIVE mg/dL
Specific Gravity, Urine: >1.046 — ABNORMAL HIGH (ref 1.005–1.030)
pH: 5 (ref 5.0–8.0)

## 2019-07-29 MED ORDER — HYDROCODONE-ACETAMINOPHEN 5-325 MG PO TABS: 1.0000 | ORAL_TABLET | Freq: Four times a day (QID) | ORAL | 0 refills | Status: DC | PRN

## 2019-07-29 NOTE — Telephone Encounter (Signed)
Patient referred by Forestine Na er

## 2019-07-29 NOTE — Discharge Instructions (Signed)
Be sure to stay hydrated. You will need to have an ultrasound of your abdomen in about 5 years to follow the aorta SEEK IMMEDIATE MEDICAL ATTENTION IF: The pain does not go away or becomes severe, particularly over the next 8-12 hours.  A temperature above 100.43F develops.  Repeated vomiting occurs (multiple episodes).  The pain becomes localized to portions of the abdomen. The right side could possibly be appendicitis. In an adult, the left lower portion of the abdomen could be colitis or diverticulitis.  Blood is being passed in stools or vomit (bright red or black tarry stools).  Return also if you develop chest pain, difficulty breathing, dizziness or fainting, or become confused, poorly responsive, or inconsolable.

## 2019-07-29 NOTE — ED Provider Notes (Signed)
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  Advised patient to increase fluids as he is dehydrated.  He requests referral to GI due to frequent episodes of abdominal pain. He is improved.  ABD soft to palpation His urinalysis is negative.  Short course of pain medicine given.  Patient demanding discharge   Ripley Fraise, MD 07/29/19 0202

## 2019-08-01 ENCOUNTER — Encounter: Payer: Self-pay | Admitting: Gastroenterology

## 2019-08-01 NOTE — Telephone Encounter (Signed)
PATIENT SCHEDULED AND LETTER SENT  °

## 2019-08-01 NOTE — Telephone Encounter (Signed)
Ok to schedule ov.  

## 2019-08-17 NOTE — Progress Notes (Signed)
Referring Provider: Zadie Rhine, MD [facility] Physician) Primary Care Physician:  Deatra James, MD Primary Gastroenterologist:  Dr. Jena Gauss  Chief Complaint  Patient presents with   Abdominal Pain    right abd    HPI:   Rodney Rangel is a 53 y.o. male with past medical history of HLD, prediabetes, and CAD s/p RCA PCI with DES in 2019. He had recurrent chest pain after stent placement and Imdur was added with resolution of symptoms. On Brilinta until March 2020. He is presenting today for ER follow-up.    Patient was seen in [facility] ED on 07/28/19 with RLQ pain that radiated to RUQ. Had been intermittent for the last year but much more severe.   CTA: Abdominal aortic ectasia with need for surveillance. Celiac, SMA, and IMA patent. Bladder wall thickening with perivesicular hazy stranding. Tiny fecalith at the tip of the otherwise normal appendix. No periappendiceal stranding or inflammation. No small bowel or colonic dilation or wall thickening. Scattered diverticula without diverticulitis. Normal gallbladder. Small left inguinal hernia.  Multilevel degenerative changes in imaged portions of the spine most pronounced at L5-S1. Labs: Elevated Cr 2.04, electrolytes within normal limits, normal LFTs, WBC slightly elevated at 13.0, hemoglobin 16.2, platelets normal 240, urinalysis without evidence of infection, lipase normal.  Patient was given fluids, dilaudid while in the ED and discharged with short course of Norco.  Today he states over the last year to year and a half he has had intermittent RLQ pain. Feels like a muscle cramp, " just like a charley horse.". If he turns a certain way, its like a catch. Reports colonoscopy last year and MRI with [facility].  Patient reports 2 small polyps. Feels like his stomach will spasm and get hard as a rock for about 15 to 20 minutes. States it hurts so bad it feels like a knife twisting inside him. Usually can walk it off and turn a certain way  to help it ease off. States he has seen it pulsate. Pain has not returned since ER visit on 07/28/2019. Occurs about 1-2 times a month.  Always seems to occur when he turns a certain way or is getting out of his recliner.  Pain is not associated with meals or bowel movements.  Nonradiating.  Had nausea during the last episode without vomiting. Felt like he might pass out because the pain was so severe.  Normally no nausea or vomiting. Patient reports CT scan was while the pain was actively occurring. BMs daily. [place] 3-4. BMs are complete. No constipation. No diarrhea. No blood in the stool. No black stools. Heartburn every now and then. Maybe 2-3 times a month if eating spicy foods. No reflux. Takes tums as needed which resolve symptoms completely. No dysphagia. No unintentional weight loss. Has tried to cut out soda to lose weight.   No abdominal surgeries. No burning or pain with urination. Urinating every 3 hours.  States he drinks a lot of water. Urine is light.  States he has been seen by several doctors and no one can determine the cause.  Reports prior back surgery with chronic numbness in the lateral side of his right leg. Reports getting cramps in his legs.   Shot of whisky once every 6 months.   Past Medical History:  Diagnosis Date   Coronary artery disease    3/19 PCI/DESx2 to pRCA   Depression    Hyperlipidemia    Pre-diabetes     Past Surgical History:  Procedure Laterality Date   BACK SURGERY     CARDIAC CATHETERIZATION  01/07/2018   COLONOSCOPY  2019   [facility]   CORONARY STENT INTERVENTION N/A 01/07/2018   Procedure: CORONARY STENT INTERVENTION;  Surgeon: SwazilandJordan, Peter M, MD;  [facility];  Service: Cardiovascular;  Laterality: N/A;   CORONARY STENT PLACEMENT  01/07/2018   LEFT HEART CATH AND CORONARY ANGIOGRAPHY N/A 01/07/2018   Procedure: LEFT HEART CATH AND CORONARY ANGIOGRAPHY;  Surgeon: SwazilandJordan, Peter M, MD;  [facility];   Service: Cardiovascular;  Laterality: N/A;   NECK SURGERY      Current Outpatient Medications  Medication Sig Dispense Refill   aspirin 81 MG chewable tablet Chew 1 tablet (81 mg total) by mouth daily.     gabapentin (NEURONTIN) 800 MG tablet Take 800 mg by mouth daily.     PARoxetine (PAXIL) 40 MG tablet Take 40 mg by mouth daily.     rosuvastatin (CRESTOR) 40 MG tablet Take 1 tablet (40 mg total) by mouth daily. 90 tablet 3   nitroGLYCERIN (NITROSTAT) 0.4 MG SL tablet Place 1 tablet (0.4 mg total) under the tongue every 5 (five) minutes as needed. (Patient not taking: Reported on 08/18/2019) 25 tablet 2   Current Facility-Administered Medications  Medication Dose Route Frequency Provider Last Rate Last Dose   nitroGLYCERIN (NITROSTAT) SL tablet 0.4 mg  0.4 mg Sublingual Q5 min PRN Corine ShelterKilroy, Luke K, PA-C        Allergies as of 08/18/2019   (No Known Allergies)    Family History  Problem Relation Age of Onset   CAD Father    Lung cancer Father    Colon cancer Neg Hx     Social History   Socioeconomic History   Marital status: Married    Spouse name: Not on file   Number of children: Not on file   Years of education: Not on file   Highest education level: Not on file  Occupational History   Not on file  Social Needs   Financial resource strain: Not on file   Food insecurity    Worry: Not on file    Inability: Not on file   Transportation needs    Medical: Not on file    Non-medical: Not on file  Tobacco Use   Smoking status: Current Some Day Smoker    Packs/day: 0.50    Types: Cigarettes   Smokeless tobacco: Never Used  Substance and Sexual Activity   Alcohol use: Yes    Comment: "very little"   Drug use: No   Sexual activity: Not on file  Lifestyle   Physical activity    Days per week: Not on file    Minutes per session: Not on file   Stress: Not on file  Relationships   Social connections    Talks on phone: Not on file    Gets  together: Not on file    Attends religious service: Not on file    Active member of club or organization: Not on file    Attends meetings of clubs or organizations: Not on file    Relationship status: Not on file   Intimate partner violence    Fear of current or ex partner: Not on file    Emotionally abused: Not on file    Physically abused: Not on file    Forced sexual activity: Not on file  Other Topics Concern   Not on file  Social History  Narrative   Not on file    Review of Systems: Gen: Denies any fever, chills.  HEENT: No cold or flu like symptoms CV: Denies chest pain, heart palpitations.  Resp: Denies shortness of breath or cough GI: See HPI GU : Denies urinary burning MS: Denies joint pain Derm: Denies rash Psych: Medications control depression well Heme: Denies bruising, bleeding  Physical Exam: BP (!) 140/96    Pulse 100    Temp (!) 96.8 F (36 C) (Temporal)    Ht 6' (1.829 m)    Wt 236 lb 3.2 oz (107.1 kg)    BMI 32.03 kg/m  General:   Alert and oriented. Well-nourished and well-developed. No acute distress. Head:  Normocephalic and atraumatic. Eyes:  Without icterus, sclera clear and conjunctiva pink.  Ears:  Normal auditory acuity. Nose:  No deformity, discharge,  or lesions. Lungs:  Clear to auscultation bilaterally. No wheezes, rales, or rhonchi. No distress.  Heart:  S1, S2 present without murmurs appreciated.  Abdomen:  +BS, soft, non-tender and non-distended. Able to appreciate defect where small umbilical hernia would be. Not protruding during exam. No HSM noted. No guarding or rebound. No masses appreciated.  Rectal:  Deferred  Msk:  Symmetrical without gross deformities. Normal posture. Extremities:  Without edema. Neurologic:  Alert and  oriented x4;  grossly normal neurologically. Skin:  Intact without significant lesions or rashes. Psych: Normal mood and affect. Became frustrated and decided to leave when discussing plan.

## 2019-08-18 ENCOUNTER — Other Ambulatory Visit: Payer: Self-pay

## 2019-08-18 ENCOUNTER — Encounter: Payer: Self-pay | Admitting: Gastroenterology

## 2019-08-18 ENCOUNTER — Ambulatory Visit (INDEPENDENT_AMBULATORY_CARE_PROVIDER_SITE_OTHER): Payer: 59 | Admitting: Gastroenterology

## 2019-08-18 DIAGNOSIS — G8929 Other chronic pain: Secondary | ICD-10-CM | POA: Insufficient documentation

## 2019-08-18 DIAGNOSIS — R1031 Right lower quadrant pain: Secondary | ICD-10-CM | POA: Diagnosis not present

## 2019-08-18 NOTE — Patient Instructions (Addendum)
1. Please call cardiology and request a follow-up appointment.   2. I feel the pain in your right lower abdomen is musculoskeletal. I would advise you see your PCP about this again. Please call us when you experience symptoms again and we can try to order stat imaging to evaluate this further. I would also be helpful if you could take a picture/video of the pulsating mass so we can see exactly what you are experiencing.   3. I will discuss your case with Dr. Gala Romney to see if he has any other recommendations.   Aliene Altes, PA-C [facility]

## 2019-08-18 NOTE — Assessment & Plan Note (Addendum)
53 year old male with past medical history of HLD, prediabetes, and CAD s/p RCA PCI with DES in 2019 who is presenting for further evaluation of chronic intermittent sharp/stabbing RLQ pain that has been present over the last 1.5 years.  Occurs 1-2 times a month, feels " just like a charley horse", associated with localized hardening/tightening in the RLQ, lasting 15-20 minutes.  Triggers include turning a certain way or getting out of his recliner.  Not associated with eating or bowel movements. Will walk the pain off or turn a certain way to help ease the pain. Typically no nausea with pain; however, last episode on 07/28/2019 was associated with more severe pain than normal, nausea, and feeling like he may pass out due to severity of pain.  This resulted in an ER visit with CTA while pain was occurring that was unrevealing.  He did have bladder wall thickening with perivesicular hazy stranding although urinalysis without infection. Also with tiny fecalith at the tip of the otherwise normal appendix.  No evidence of small bowel or colonic dilation or wall thickening.  Scattered diverticula without diverticulitis.  No hernia noted on the right side.  He did have multilevel degenerative changes in the imaged portions of the spine most pronounced at L5-S1.  Labs at that time with elevated creatinine at 2.04, electrolytes within normal limits, normal LFTs, WBC slightly elevated at 13, normal hemoglobin and platelets. Lipase normal.  He denies any other significant upper or lower GI symptoms.  Bowels move daily without constipation or diarrhea.  Denies bright red blood per rectum or melena.  No other alarm symptoms.  Reports work-up in the past with [facility] including colonoscopy last year with 2 polyps. He does have chronic numbness in the lateral aspect of his right leg from prior back surgery and also reports getting cramps in his legs.  Etiology is not clear but does not seem GI related. Symptoms seem most  consistent with a musculoskeletal cause.  As patient is currently asymptomatic, I do not feel any further imaging at this time would be beneficial as his CTA during acute episode of pain was unrevealing.  I discussed this with patient and advised he avoid heavy lifting and straining, continue drinking plenty of water, and follow-up with his PCP on possible musculoskeletal component.  Also advised he call us back if his pain returns and we could pursue stat imaging.  Patient became very frustrated while discussing my recommendations and decided to leave the office during our discussion.  We are mailing patients after visit summary to him.  I will try to request prior records from [place].  Will discuss with Dr. Gala Romney

## 2019-08-27 IMAGING — CT CT ANGIO CHEST-ABD-PELV FOR DISSECTION W/ AND WO/W CM
2 of 7 series · 14 of 46 positions shown, 16 images · IV contrast (iopamidol)
Comparison: CT abdomen pelvis dated January 07, 2005.

CLINICAL DATA: Substernal chest pain and epigastric pain.

EXAM:
CT ANGIOGRAPHY CHEST, ABDOMEN AND PELVIS
TECHNIQUE: Multidetector CT imaging through the chest, abdomen and pelvis was
performed using the standard protocol during bolus administration of
intravenous contrast. Multiplanar reconstructed images and MIPs were
obtained and reviewed to evaluate the vascular anatomy.
CONTRAST:  100mL UPJ2IR-PKA IOPAMIDOL (UPJ2IR-PKA) INJECTION 76%

[Series 7: dissection 2mm · axial · 0.98mm/px · z∈[+816,+1466]mm · 11 of 365 slices shown, 13 images]
[im 20/365  soft-tissue]
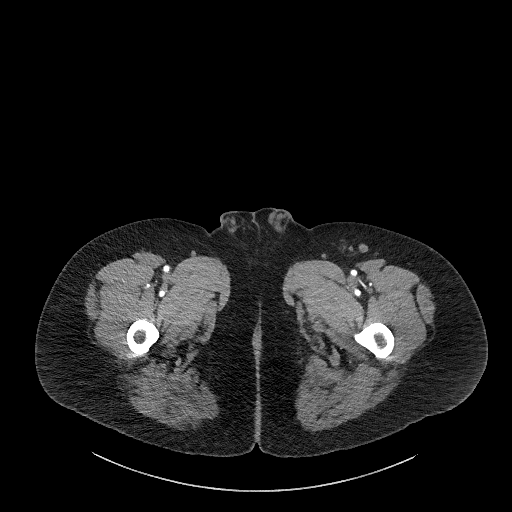
[im 20/365  bone]
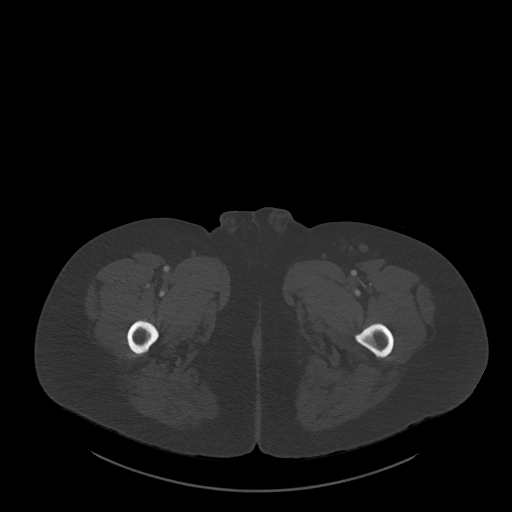
[im 58/365  soft-tissue]
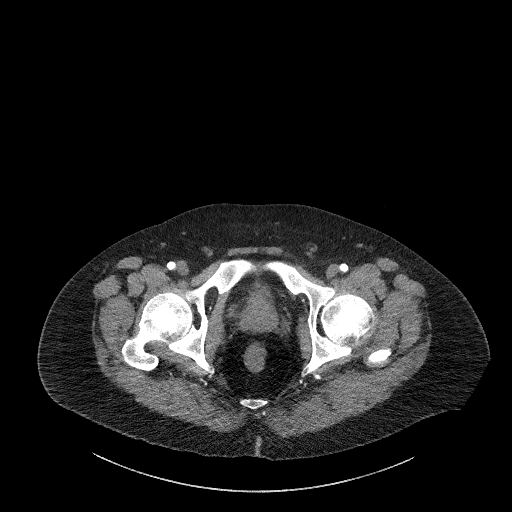
[im 96/365  soft-tissue]
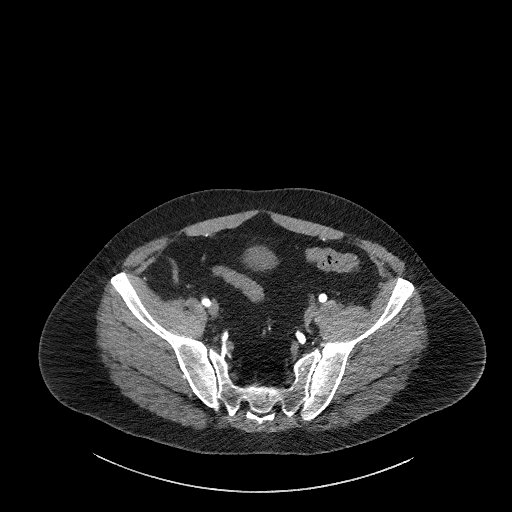
[im 115/365  soft-tissue]
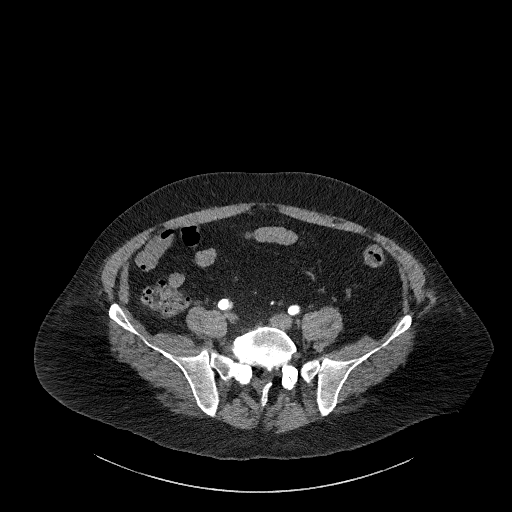
[im 154/365  soft-tissue]
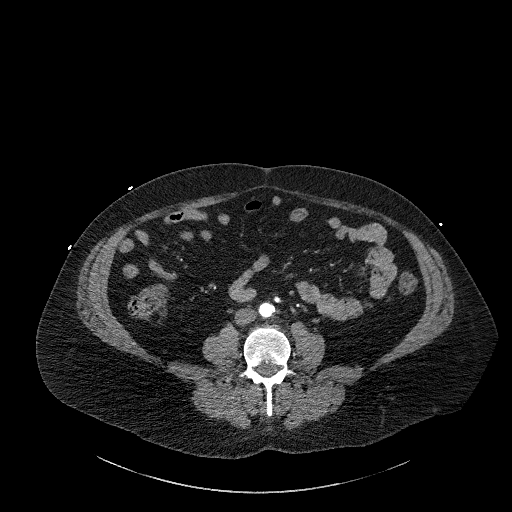
[im 192/365  soft-tissue]
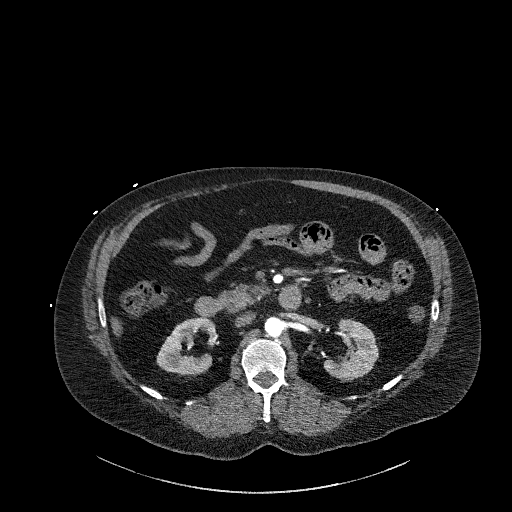
[im 211/365  soft-tissue]
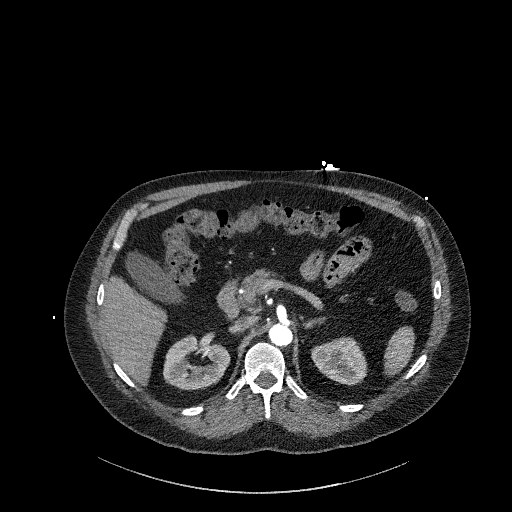
[im 250/365  soft-tissue]
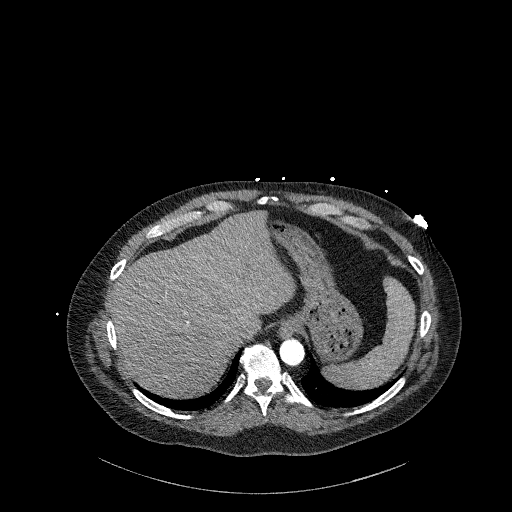
[im 269/365  soft-tissue]
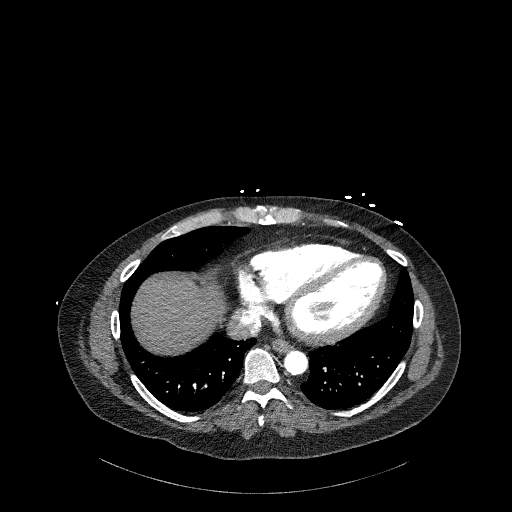
[im 269/365  bone]
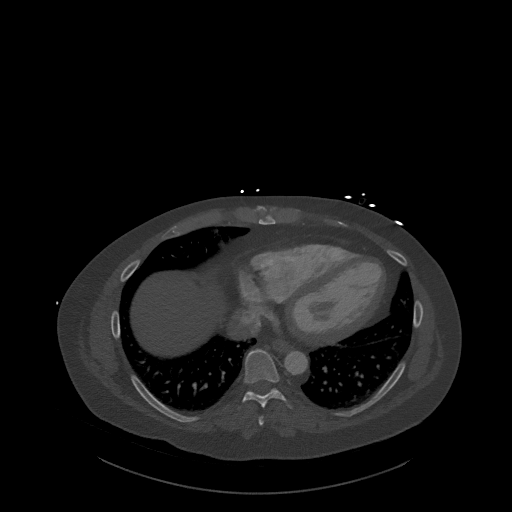
[im 307/365  soft-tissue]
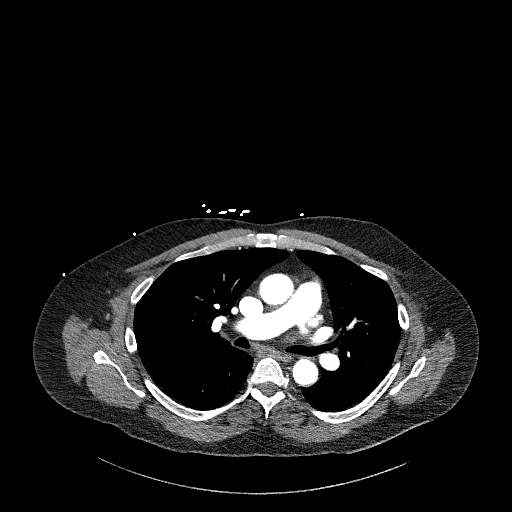
[im 345/365  soft-tissue]
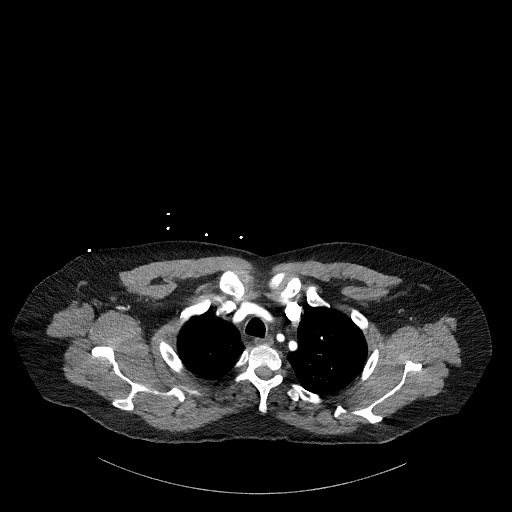

[Series 10: dissection 2mm cor · coronal · 0.93mm/px · 3 of 178 slices shown]
[im 45/178  soft-tissue]
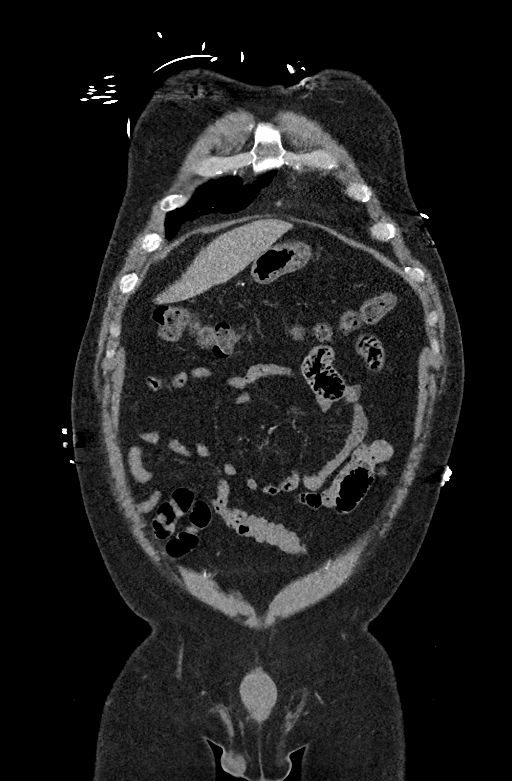
[im 89/178  soft-tissue]
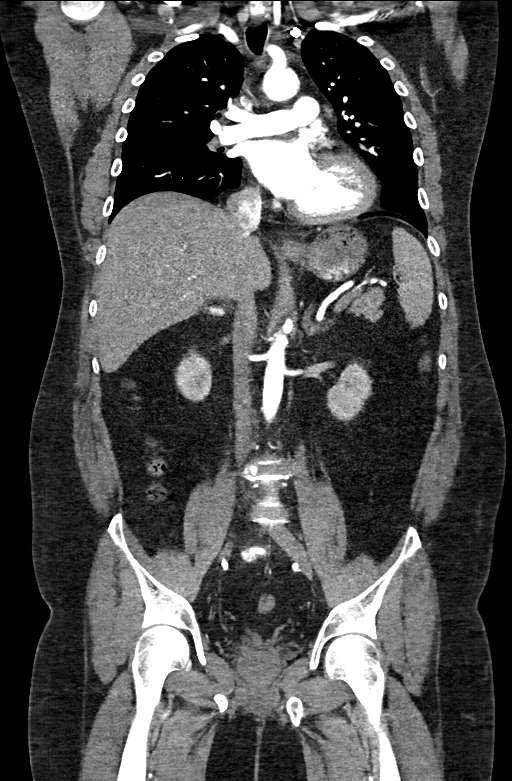
[im 133/178  soft-tissue]
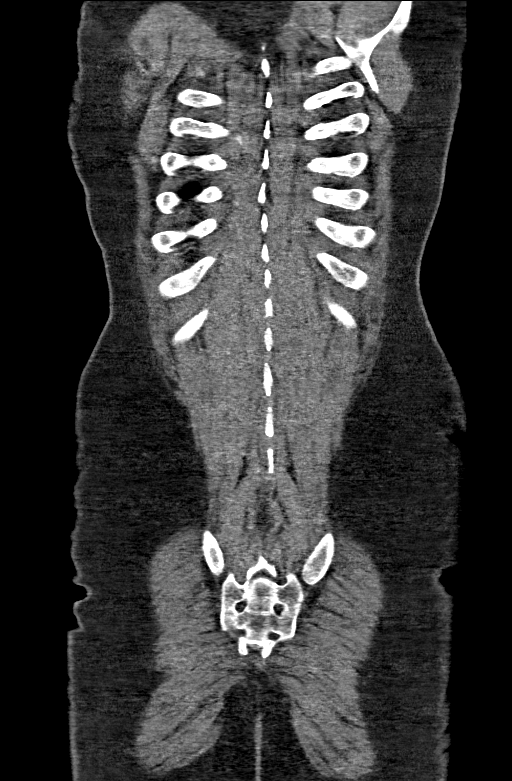

[14 of 46 positions shown; findings below may reference images not displayed]

FINDINGS: CTA CHEST FINDINGS

Cardiovascular: No evidence of thoracic aortic aneurysm or
dissection. The heart size is at the upper limits of normal. Stent
in the right coronary artery. No pericardial effusion. No pulmonary
embolism.

Mediastinum/Nodes: No enlarged mediastinal, hilar, or axillary lymph
nodes. Thyroid gland, trachea, and esophagus demonstrate no
significant findings.

Lungs/Pleura: Minimal bibasilar atelectasis. No focal consolidation,
pleural effusion, or pneumothorax. No suspicious pulmonary nodule.

Musculoskeletal: No chest wall abnormality. No acute or significant
osseous findings.

Review of the MIP images confirms the above findings.

CTA ABDOMEN AND PELVIS FINDINGS

VASCULAR

Aorta: Focal ectasia of the infrarenal abdominal aorta measuring
cm. No aneurysm, dissection, vasculitis or significant stenosis.
Mild calcified and noncalcified atherosclerosis.

Celiac: Patent without evidence of aneurysm, dissection, vasculitis
or significant stenosis. Median arcuate configuration.

SMA: Patent without evidence of aneurysm, dissection, vasculitis or
significant stenosis.

Renals: Both renal arteries are patent without evidence of aneurysm,
dissection, vasculitis, fibromuscular dysplasia or significant
stenosis.

IMA: Patent without evidence of aneurysm, dissection, vasculitis or
significant stenosis.

Inflow: Small focal dissection of the right common iliac artery just
proximal to the bifurcation. No aneurysm. Remaining inflow vessels
are patent without evidence of aneurysm, dissection, vasculitis or
significant stenosis.

Veins: No obvious venous abnormality within the limitations of this
arterial phase study.

Review of the MIP images confirms the above findings.

NON-VASCULAR

Hepatobiliary: Subcentimeter low-density lesion in segment 8 is too
small to characterize. Cholelithiasis. No biliary dilatation.

Pancreas: Unremarkable. No pancreatic ductal dilatation or
surrounding inflammatory changes.

Spleen: Normal in size without focal abnormality.

Adrenals/Urinary Tract: Adrenal glands are unremarkable. Kidneys are
normal, without renal calculi, focal lesion, or hydronephrosis.
Bladder is unremarkable.

Stomach/Bowel: Stomach is within normal limits. Appendix appears
normal. No evidence of bowel wall thickening, distention, or
inflammatory changes.

Lymphatic: No significant vascular findings are present. No enlarged
abdominal or pelvic lymph nodes.

Reproductive: Prostate is unremarkable.

Other: Small fat containing umbilical hernia. No pneumoperitoneum or
free fluid.

Musculoskeletal: No acute or significant osseous findings.
Degenerative disc disease at L5-S1.

Review of the MIP images confirms the above findings.
IMPRESSION: 1. No evidence of thoracic or abdominal aortic dissection.
2. Mild focal ectasia of the infrarenal abdominal aorta, measuring
up to 2.1 cm.
3. Small focal dissection of the right common iliac artery without
stenosis or aneurysm.
4.  Aortic atherosclerosis (MXIQ8-CSF.F).
5. Cholelithiasis.

## 2020-03-05 ENCOUNTER — Telehealth: Payer: 59 | Admitting: Cardiology

## 2020-07-03 ENCOUNTER — Telehealth: Payer: Self-pay | Admitting: Gastroenterology

## 2020-07-03 NOTE — Telephone Encounter (Signed)
Received and reviewed colonoscopy records from [facility].   Report dated 01/24/2014. Indication: Abnormal CT of GI tract, diffuse colonic wall thickening.  Impression: One 4 mm polyp in the transverse colon. Resected and retrieved. One 3 mm polyp in the rectum. Resected and retrieved. The examination was otherwise normal.   Pathology:  Both polyps were hyperplastic.   Patient should be due for repeat colonoscopy in 2025.   Alicia: Please let patient know I have received a colonoscopy report from [facility] dated 01/24/2014. He should be due for colonoscopy in 2025. If he would like to continue his care with [place], we can nic him in the system for a reminder on scheduling colonoscopy.

## 2020-07-03 NOTE — Telephone Encounter (Signed)
Spoke with pt. Pt was notified that records were reviewed. Pt does want to continue care with our office. Please NIC for TCS 2025

## 2020-07-03 NOTE — Telephone Encounter (Signed)
Added nic for tcs 2025

## 2021-03-01 IMAGING — CT CT CTA ABD/PEL W/CM AND/OR W/O CM
2 of 7 series · 14 of 46 positions shown, 16 images · IV contrast (omnipaque)
Comparison: CT abdomen pelvis 01/22/2018

CLINICAL DATA: Rule out appendicitis, right lower quadrant pain

EXAM:
CTA ABDOMEN AND PELVIS WITH CONTRAST
TECHNIQUE: Multidetector CT imaging of the abdomen and pelvis was performed
using the standard protocol during bolus administration of
intravenous contrast. Multiplanar reconstructed images and MIPs were
obtained and reviewed to evaluate the vascular anatomy.
CONTRAST:  100mL OMNIPAQUE IOHEXOL 350 MG/ML SOLN

[Series 4: arterial · axial · arterial · 0.87mm/px · z∈[+950,+1368]mm · 11 of 245 slices shown, 13 images]
[im 18/245  soft-tissue]
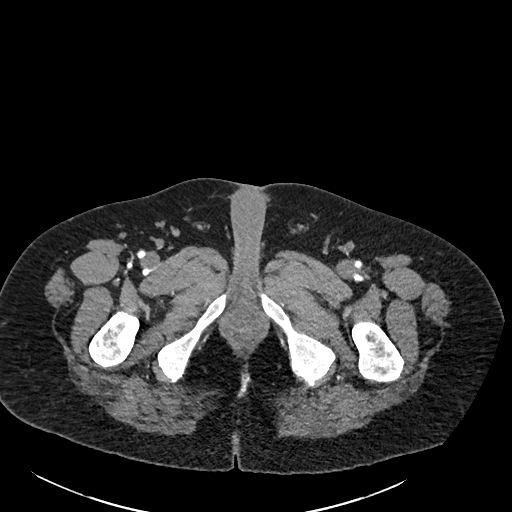
[im 18/245  bone]
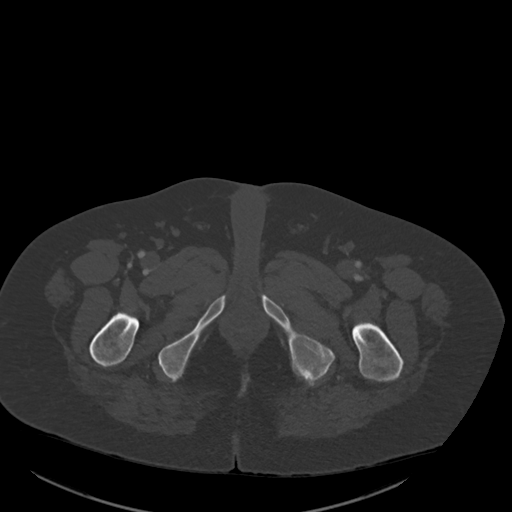
[im 35/245  soft-tissue]
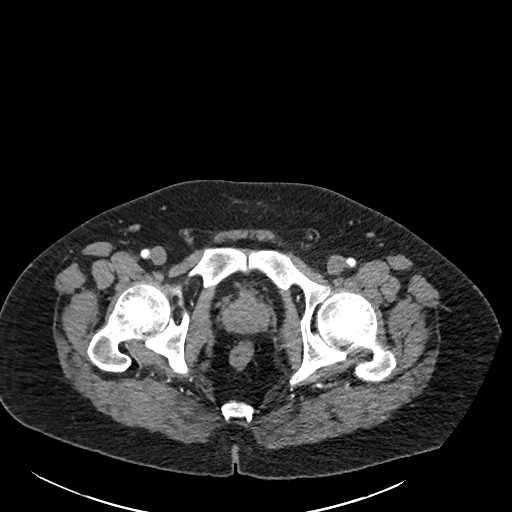
[im 53/245  soft-tissue]
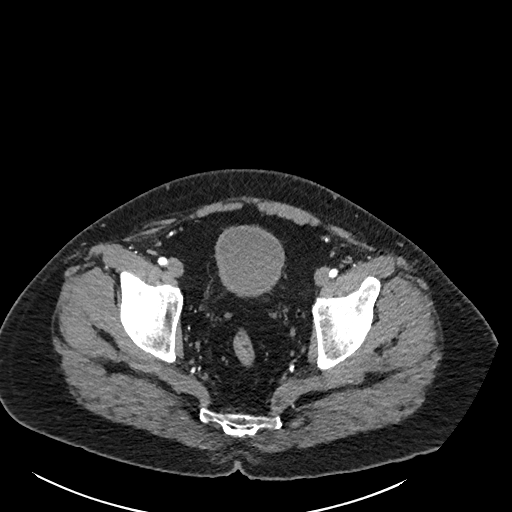
[im 88/245  soft-tissue]
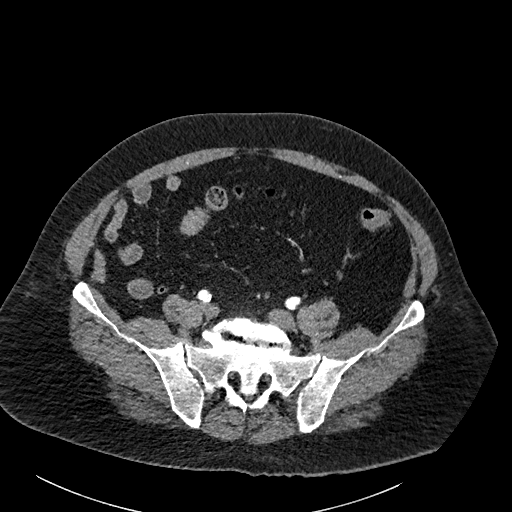
[im 105/245  soft-tissue]
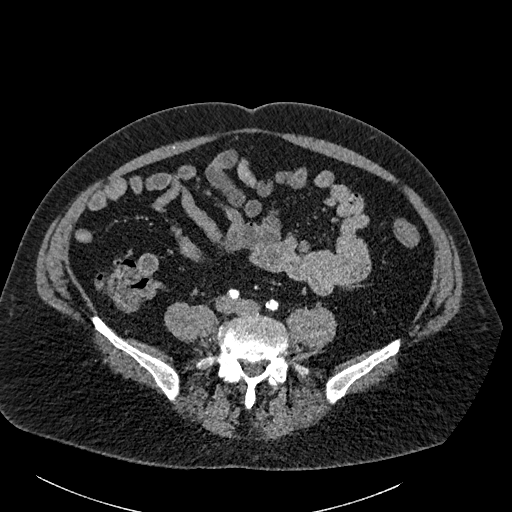
[im 122/245  soft-tissue]
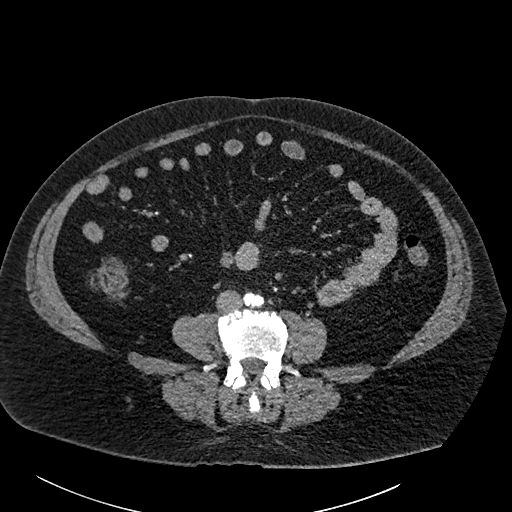
[im 140/245  soft-tissue]
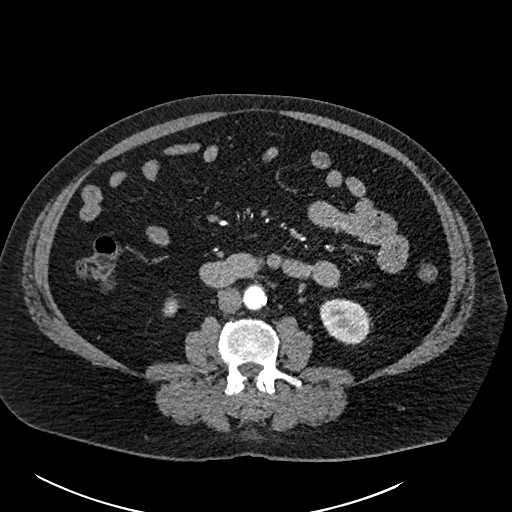
[im 157/245  soft-tissue]
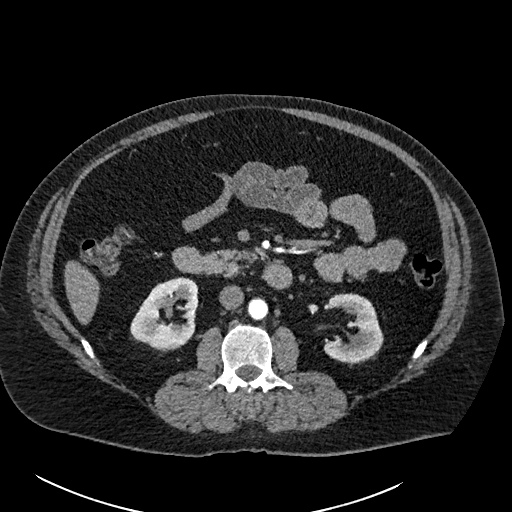
[im 192/245  soft-tissue]
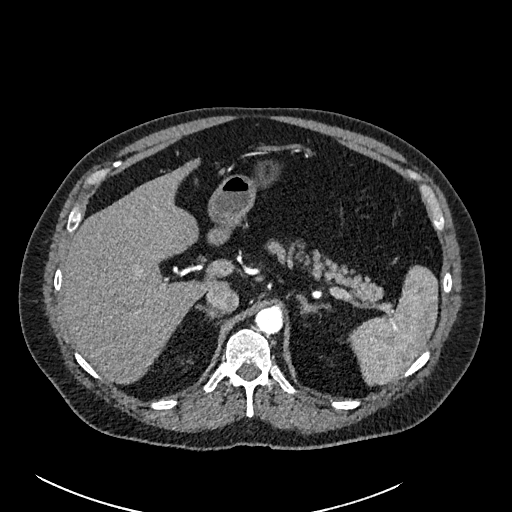
[im 192/245  bone]
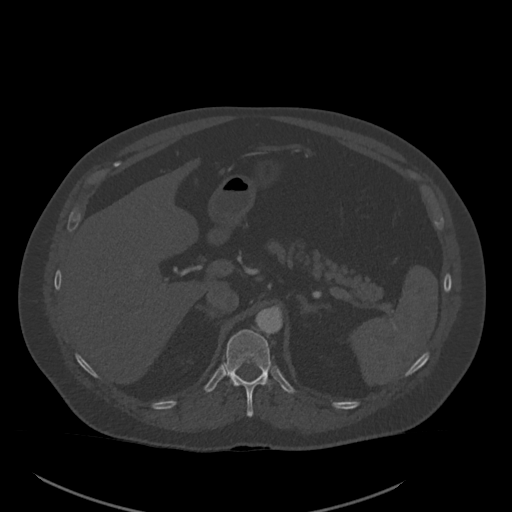
[im 210/245  soft-tissue]
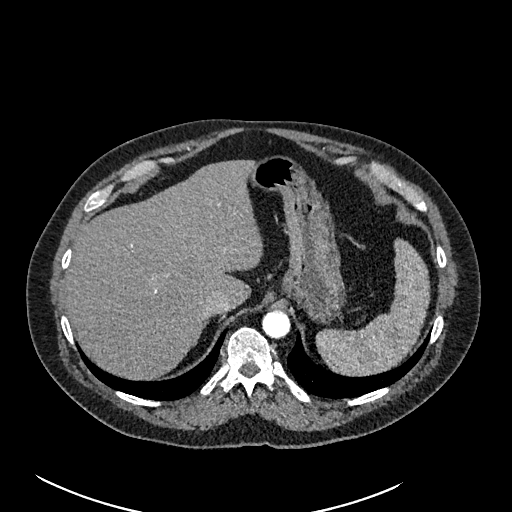
[im 227/245  soft-tissue]
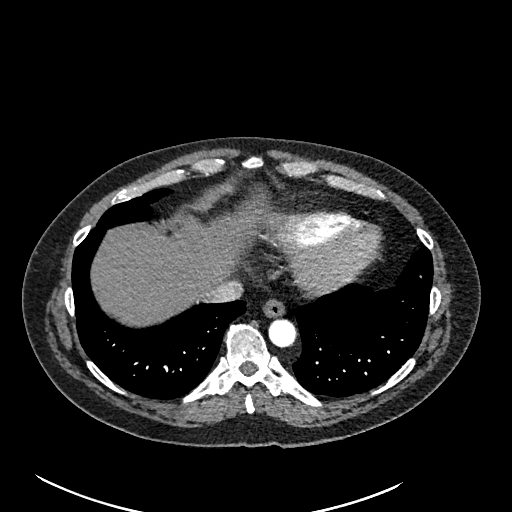

[Series 7: cor arterial · coronal · arterial · 0.95mm/px · 3 of 161 slices shown]
[im 41/161  soft-tissue]
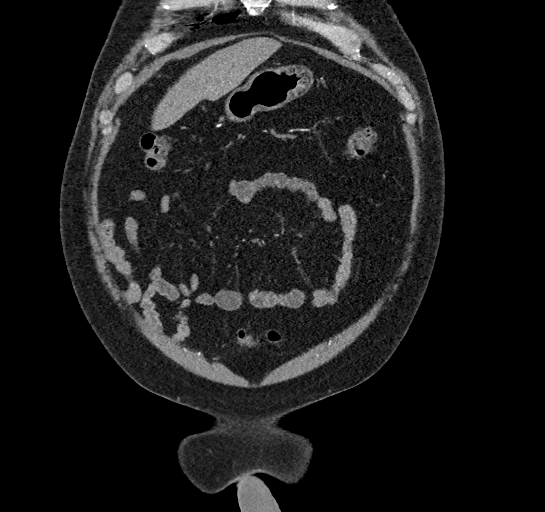
[im 81/161  soft-tissue]
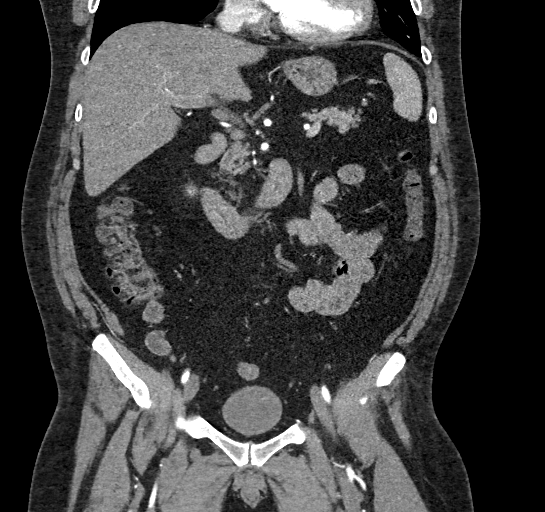
[im 121/161  soft-tissue]
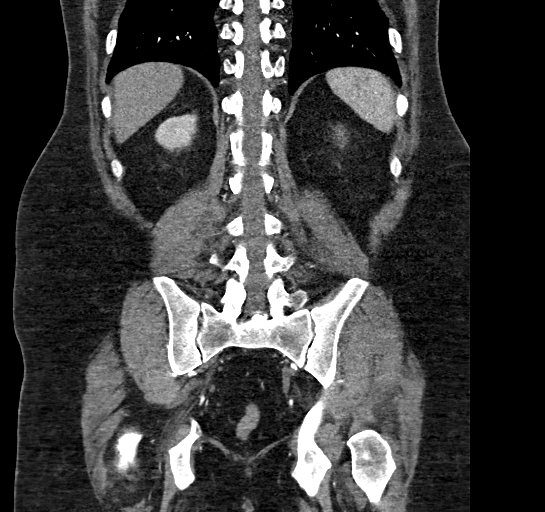

[14 of 46 positions shown; findings below may reference images not displayed]

FINDINGS: VASCULAR

Aorta: Scattered calcified and noncalcified atheromatous plaque is
seen within the abdominal aorta. There is focal fusiform ectasia of
the infrarenal abdominal aorta up to 2 cm arising approximately
cm below the level of the renal arteries. No evidence of dissection
or vasculitis.

Celiac: Patent without evidence of aneurysm, dissection, vasculitis
or significant stenosis.

SMA: Patent without evidence of aneurysm, dissection, vasculitis or
significant stenosis.

Renals: Single bilateral renal arteries. Both renal arteries are
patent without evidence of aneurysm, dissection, vasculitis,
fibromuscular dysplasia or significant stenosis.

IMA: Patent without evidence of aneurysm, dissection, vasculitis or
significant stenosis.

Inflow: Scattered atheromatous plaque. Patent without evidence of
aneurysm, dissection, vasculitis or significant stenosis.

Proximal Outflow: Scattered atheromatous plaque.Bilateral common
femoral and visualized portions of the superficial and profunda
femoral arteries are patent without evidence of aneurysm,
dissection, vasculitis or significant stenosis.

Veins: No obvious venous abnormality within the limitations of this
arterial phase study.

Review of the MIP images confirms the above findings.

NON-VASCULAR

Lower chest: Few bandlike opacities in the lung bases likely
reflecting atelectasis and/or scarring. Normal heart size. No
pericardial effusion.

Hepatobiliary: No focal liver abnormality is seen. No gallstones,
gallbladder wall thickening, or biliary dilatation.

Pancreas: Unremarkable. No pancreatic ductal dilatation or
surrounding inflammatory changes.

Spleen: Normal in size without focal abnormality.

Adrenals/Urinary Tract: Adrenal glands are unremarkable. Kidneys are
normal, without renal calculi, focal lesion, or hydronephrosis. No
extravasation of contrast is seen on excretory phase delayed
imaging. Circumferential bladder wall thickening with perivesicular
hazy stranding.

Stomach/Bowel: Distal esophagus, stomach and duodenal sweep are
unremarkable. No small bowel wall thickening or dilatation. No
evidence of obstruction. Tiny fecalith at the tip of the otherwise
normal appendix. No periappendiceal stranding or inflammation. No
colonic dilatation or wall thickening. Scattered colonic diverticula
without focal pericolonic inflammation to suggest diverticulitis.

Lymphatic: No suspicious or enlarged lymph nodes in the included
lymphatic chains.

Reproductive: Coarse eccentric calcification of the prostate. No
concerning abnormalities of the prostate or seminal vesicles.

Other: No abdominopelvic free fluid or free gas. No bowel containing
hernias. Small fat containing left inguinal hernia.

Musculoskeletal: Multilevel degenerative changes are present in the
imaged portions of the spine. Features are most pronounced at L5-S1.
No acute osseous abnormality or suspicious osseous lesion.
IMPRESSION: VASCULAR

1. Focal fusiform infrarenal abdominal aortic ectasia up to 2 cm in
size, not significantly changed from comparison exam January 13, 2018.
Ectatic abdominal aorta may be at risk for aneurysm development.
Recommend followup by ultrasound in 5 years. This recommendation
follows ACR consensus guidelines: White Paper of the ACR Incidental
Findings Committee II on Vascular Findings. [HOSPITAL] 5979;
[DATE]. Aortic aneurysm NOS (A1Q6X-83V.O)
2. Aortic Atherosclerosis (A1Q6X-22G.G).
3. No acute abdominopelvic vascular abnormality.

NON-VASCULAR

1. No CT evidence of appendicitis. Incidental note made of a tiny
appendiceal fecalith.
2. Circumferential bladder wall thickening, correlate with urinary
symptoms and consider urinalysis to exclude cystitis.
3. Fat containing left inguinal hernia.

## 2022-07-08 ENCOUNTER — Ambulatory Visit: Payer: 59 | Attending: Cardiovascular Disease | Admitting: Cardiovascular Disease

## 2022-07-08 ENCOUNTER — Encounter: Payer: Self-pay | Admitting: Cardiovascular Disease

## 2022-07-08 DIAGNOSIS — Z9861 Coronary angioplasty status: Secondary | ICD-10-CM | POA: Diagnosis not present

## 2022-07-08 DIAGNOSIS — Z8249 Family history of ischemic heart disease and other diseases of the circulatory system: Secondary | ICD-10-CM

## 2022-07-08 DIAGNOSIS — Z87891 Personal history of nicotine dependence: Secondary | ICD-10-CM | POA: Diagnosis not present

## 2022-07-08 DIAGNOSIS — E785 Hyperlipidemia, unspecified: Secondary | ICD-10-CM | POA: Diagnosis not present

## 2022-07-08 DIAGNOSIS — I2510 Atherosclerotic heart disease of native coronary artery without angina pectoris: Secondary | ICD-10-CM | POA: Diagnosis not present

## 2022-07-08 MED ORDER — NITROGLYCERIN 0.4 MG SL SUBL: 0.4000 mg | SUBLINGUAL_TABLET | SUBLINGUAL | 3 refills | Status: AC | PRN

## 2022-07-08 NOTE — Assessment & Plan Note (Signed)
Family history of heart disease with both mother and father both had ischemic heart disease.  His mother was Dago Jungwirth  and father was Shashank Kwasnik  both of whom are patients of mine.

## 2022-07-08 NOTE — Assessment & Plan Note (Signed)
History of over 70-pack-year tobacco abuse currently smoking 1/2 pack/day recalcitrant to resector modification.

## 2022-07-08 NOTE — Assessment & Plan Note (Signed)
History of CAD status post RCA intervention by Dr. Swaziland in setting of acute coronary syndrome with a 3.6 mm x 16 mm long Synergy drug-eluting stent.  The remainder of his anatomy was free of disease.  He has normal LV systolic function.  Unfortunately, he is not on aspirin which I have told him to begin.  He did have an episode of chest pain 2 weeks ago which resolved with sublingual nitroglycerin.

## 2022-07-08 NOTE — Assessment & Plan Note (Signed)
History of dyslipidemia on statin therapy with lipid profile performed 08/28/2021 revealing total cholesterol 145, LDL 79 and HDL of 30.

## 2022-07-08 NOTE — Progress Notes (Signed)
07/08/2022 Rodney Rangel   1965-11-08  [id number]  Primary Physician Deatra James, MD Primary Cardiologist: Runell Gess MD Milagros Loll, [place], [place]  HPI:  Rodney Rangel is a 56 y.o. moderately overweight married Caucasian male with no children is accompanied by his wife Rodney Rangel today.  He was referred back to me by his PCP, Dr. Wynelle Link, [facility] established.  He works in a [employer]".  History factors include greater than 70 pack years of tobacco abuse currently smoking 1/2 pack/day, treated hyperlipidemia and diabetes.  Both his parents had CAD.  He had unstable angina 01/07/2018 and underwent cardiac catheterization by Dr. Swaziland revealing a high-grade proximal RCA stenosis that was treated with 2 Synergy drug-eluting stents.  Remainder of his coronary artery was free of disease.  He had normal LV systolic function.  He has done well since that time until 2 weeks ago when he developed an episode of chest pain which resolved with sublingual nitroglycerin.   Current Meds  Medication Sig   gabapentin (NEURONTIN) 800 MG tablet Take 800 mg by mouth daily.   PARoxetine (PAXIL) 40 MG tablet Take 40 mg by mouth daily.   rosuvastatin (CRESTOR) 40 MG tablet Take 1 tablet (40 mg total) by mouth daily.     No Known Allergies  Social History   Socioeconomic History   Marital status: Married    Spouse name: Not on file   Number of children: Not on file   Years of education: Not on file   Highest education level: Not on file  Occupational History   Not on file  Tobacco Use   Smoking status: Some Days    Packs/day: 0.50    Types: Cigarettes   Smokeless tobacco: Never  Vaping Use   Vaping Use: Some days  Substance and Sexual Activity   Alcohol use: Yes    Comment: "very little"   Drug use: No   Sexual activity: Not on file  Other Topics Concern   Not on file  Social History Narrative   Not on file   Social Determinants of Health   Financial Resource Strain: Not on file   Food Insecurity: Not on file  Transportation Needs: Not on file  Physical Activity: Not on file  Stress: Not on file  Social Connections: Not on file  Intimate Partner Violence: Not on file     Review of Systems: General: negative for chills, fever, night sweats or weight changes.  Cardiovascular: negative for chest pain, dyspnea on exertion, edema, orthopnea, palpitations, paroxysmal nocturnal dyspnea or shortness of breath Dermatological: negative for rash Respiratory: negative for cough or wheezing Urologic: negative for hematuria Abdominal: negative for nausea, vomiting, diarrhea, bright red blood per rectum, melena, or hematemesis Neurologic: negative for visual changes, syncope, or dizziness All other systems reviewed and are otherwise negative except as noted above.    Blood pressure (!) 132/90, pulse 78, height 6\' 1"  (1.854 m), weight 232 lb 9.6 oz (105.5 kg), SpO2 93 %.  General appearance: alert and no distress Neck: no adenopathy, no carotid bruit, no JVD, supple, symmetrical, trachea midline, and thyroid not enlarged, symmetric, no tenderness/mass/nodules Lungs: clear to auscultation bilaterally Heart: regular rate and rhythm, S1, S2 normal, no murmur, click, rub or gallop Extremities: extremities normal, atraumatic, no cyanosis or edema Pulses: 2+ and symmetric Skin: Skin color, texture, turgor normal. No rashes or lesions Neurologic: Grossly normal  EKG sinus rhythm at 78 without ST or T wave changes.  Personally reviewed  this EKG.  ASSESSMENT AND PLAN:   Dyslipidemia History of dyslipidemia on statin therapy with lipid profile performed 08/28/2021 revealing total cholesterol 145, LDL 79 and HDL of 30.  CAD S/P percutaneous coronary angioplasty History of CAD status post RCA intervention by Dr. Swaziland in setting of acute coronary syndrome with a 3.6 mm x 16 mm long Synergy drug-eluting stent.  The remainder of his anatomy was free of disease.  He has normal LV  systolic function.  Unfortunately, he is not on aspirin which I have told him to begin.  He did have an episode of chest pain 2 weeks ago which resolved with sublingual nitroglycerin.  Former smoker History of over 70-pack-year tobacco abuse currently smoking 1/2 pack/day recalcitrant to resector modification.  Family history of premature CAD Family history of heart disease with both mother and father both had ischemic heart disease.  His mother was Rodney Rangel  and father was Rodney Rangel  both of whom are patients of mine.     Runell Gess MD FACP,FACC,FAHA, [facility] 07/08/2022 2:16 PM

## 2022-07-08 NOTE — Patient Instructions (Signed)
Medication Instructions:  Your physician recommends that you continue on your current medications as directed. Please refer to the Current Medication list given to you today.  *If you need a refill on your cardiac medications before your next appointment, please call your pharmacy*   Follow-Up: At [facility], you and your health needs are our priority.  As part of our continuing mission to provide you with exceptional heart care, we have created designated Provider Care Teams.  These Care Teams include your primary Cardiologist (physician) and Advanced Practice Providers (APPs -  Physician Assistants and Nurse Practitioners) who all work together to provide you with the care you need, when you need it.  We recommend signing up for the patient portal called "MyChart".  Sign up information is provided on this After Visit Summary.  MyChart is used to connect with patients for Virtual Visits (Telemedicine).  Patients are able to view lab/test results, encounter notes, upcoming appointments, etc.  Non-urgent messages can be sent to your provider as well.   To learn more about what you can do with MyChart, go to [email or web address].    Your next appointment:   12 month(s)  The format for your next appointment:   In Person  Provider:   Jonathan Berry, MD   

## 2022-07-15 ENCOUNTER — Telehealth: Payer: Self-pay | Admitting: Cardiovascular Disease

## 2022-07-15 NOTE — Telephone Encounter (Signed)
Called pt back. Made him aware that Dr. Gwenlyn Found doesn't prescribe gabapentin. Advised pt to reach out to his PCP for a refill on this medication. Pt verbalizes understanding.

## 2022-07-15 NOTE — Telephone Encounter (Signed)
*  STAT* If patient is at the pharmacy, call can be transferred to refill team.   1. Which medications need to be refilled? (please list name of each medication and dose if known) gabapentin (NEURONTIN) 800 MG tablet  2. Which pharmacy/location (including street and city if local pharmacy) is medication to be sent to? [facility], [place] - [address] AT [address]  3. Do they need a 30 day or 90 day supply? 90.

## 2023-12-01 ENCOUNTER — Encounter (INDEPENDENT_AMBULATORY_CARE_PROVIDER_SITE_OTHER): Payer: Self-pay | Admitting: *Deleted

## 2024-03-25 ENCOUNTER — Other Ambulatory Visit: Payer: Self-pay | Admitting: Family Medicine

## 2024-03-25 ENCOUNTER — Encounter: Payer: Self-pay | Admitting: Family Medicine

## 2024-03-25 DIAGNOSIS — F17200 Nicotine dependence, unspecified, uncomplicated: Secondary | ICD-10-CM

## 2024-04-05 ENCOUNTER — Ambulatory Visit
Admission: RE | Admit: 2024-04-05 | Discharge: 2024-04-05 | Disposition: A | Discharge status: Home / Self Care | Source: Ambulatory Visit | Attending: Family Medicine | Admitting: Family Medicine

## 2024-04-05 DIAGNOSIS — F17200 Nicotine dependence, unspecified, uncomplicated: Secondary | ICD-10-CM

## 2024-06-01 ENCOUNTER — Encounter: Payer: Self-pay | Admitting: Cardiovascular Disease

## 2024-06-01 ENCOUNTER — Ambulatory Visit: Attending: Cardiology | Admitting: Cardiovascular Disease

## 2024-06-01 VITALS — BP 102/68 | HR 86 | Ht 73.0 in | Wt 222.4 lb

## 2024-06-01 DIAGNOSIS — E785 Hyperlipidemia, unspecified: Secondary | ICD-10-CM

## 2024-06-01 DIAGNOSIS — I2510 Atherosclerotic heart disease of native coronary artery without angina pectoris: Secondary | ICD-10-CM

## 2024-06-01 DIAGNOSIS — I951 Orthostatic hypotension: Secondary | ICD-10-CM | POA: Insufficient documentation

## 2024-06-01 DIAGNOSIS — Z9861 Coronary angioplasty status: Secondary | ICD-10-CM

## 2024-06-01 DIAGNOSIS — Z87891 Personal history of nicotine dependence: Secondary | ICD-10-CM | POA: Diagnosis not present

## 2024-06-01 NOTE — Assessment & Plan Note (Signed)
 Blood pressure today is 102/68.  He is not on any antihypertensive medications.  He is on Mounjaro.  We talked about getting up slowly.  I recommended compression stockings.

## 2024-06-01 NOTE — Patient Instructions (Addendum)
 Medication Instructions:  Your physician recommends that you continue on your current medications as directed. Please refer to the Current Medication list given to you today.  *If you need a refill on your cardiac medications before your next appointment, please call your pharmacy*  Follow-Up: At [facility], you and your health needs are our priority.  As part of our continuing mission to provide you with exceptional heart care, our providers are all part of one team.  This team includes your primary Cardiologist (physician) and Advanced Practice Providers or APPs (Physician Assistants and Nurse Practitioners) who all work together to provide you with the care you need, when you need it.  Your next appointment:   6 month(s)  Provider:   Josefa Beauvais, NP, Orren Fabry, PA-C, Dayna Dunn, PA-C, Lum Louis, NP, or Rollo Louder, PA-C         Then, Dorn Lesches, MD  will plan to see you again in 12 month(s).     We recommend signing up for the patient portal called MyChart.  Sign up information is provided on this After Visit Summary.  MyChart is used to connect with patients for Virtual Visits (Telemedicine).  Patients are able to view lab/test results, encounter notes, upcoming appointments, etc.  Non-urgent messages can be sent to your provider as well.   To learn more about what you can do with MyChart, go to [email or web address].   Other Instructions Dr. Lesches recommends KNEE HIGH COMPRESSION STOCKINGS            -- 20-30 mmHg (compression strength)  [facility]:  [address] [place], [place] [address] [phone]  [phone]  [facility] Sister Store: [facility] [address] [place], [place] [address] [phone]  [phone]  [facility]: [address]M-F 9-5:30) [phone]

## 2024-06-01 NOTE — Assessment & Plan Note (Signed)
 History of dyslipidemia on rosuvastatin  with lipid profile performed 07/29/2023 revealing total cholesterol 141, LDL 76 and HDL 36.

## 2024-06-01 NOTE — Assessment & Plan Note (Signed)
 History of CAD status post RCA intervention by Dr. Swaziland in the setting of unstable angina 01/07/2018 with 2 Synergy drug-eluting stents.  The remainder of his coronary artery was free of disease.  He denies chest pain or shortness of breath.

## 2024-06-01 NOTE — Progress Notes (Signed)
 06/01/2024 DREAM NODAL   19-Jul-1966  [id number]  Primary Physician Sun, Vyvyan, MD Primary Cardiologist: Dorn JINNY Lesches MD [facility], [place], [place]  HPI:  Rodney Rangel is a 58 y.o.   moderately overweight married Caucasian male with no children is accompanied by his wife Rodney Rangel today.  He was referred back to me by his PCP, Dr. Austin, Lesches established.  He works in a Equities trader at [employer].   Cardiac risk factors include greater than 70 pack years of tobacco abuse currently smoking 1/2 pack/day, treated hyperlipidemia and diabetes.  Both his parents had CAD.  He had unstable angina 01/07/2018 and underwent cardiac catheterization by Dr. Swaziland revealing a high-grade proximal RCA stenosis that was treated with 2 Synergy drug-eluting stents.  Remainder of his coronary artery was free of disease.  He had normal LV systolic function.  Since I saw him 2 years ago he has had 1 presyncopal episode which sounds like orthostasis.  He is on Bongiorno.  He continues to smoke 1/2 pack/day.  Unfortunately his brother died a week ago from a myocardial infarction.  He denies chest pain or shortness of breath.  Current Meds  Medication Sig   metFORMIN (GLUCOPHAGE-XR) 500 MG 24 hr tablet Take 500 mg by mouth daily with breakfast.   MOUNJARO 2.5 MG/0.5ML Pen Inject 2.5 mg into the skin once a week.     No Known Allergies  Social History   Socioeconomic History   Marital status: Married    Spouse name: Not on file   Number of children: Not on file   Years of education: Not on file   Highest education level: Not on file  Occupational History   Not on file  Tobacco Use   Smoking status: Some Days    Current packs/day: 0.50    Types: Cigarettes   Smokeless tobacco: Never  Vaping Use   Vaping status: Some Days  Substance and Sexual Activity   Alcohol use: Yes    Comment: very little   Drug use: No   Sexual activity: Not on file  Other Topics Concern   Not on file   Social History Narrative   Not on file   Social Drivers of Health   Financial Resource Strain: Not on file  Food Insecurity: Not on file  Transportation Needs: Not on file  Physical Activity: Not on file  Stress: Not on file  Social Connections: Not on file  Intimate Partner Violence: Not on file     Review of Systems: General: negative for chills, fever, night sweats or weight changes.  Cardiovascular: negative for chest pain, dyspnea on exertion, edema, orthopnea, palpitations, paroxysmal nocturnal dyspnea or shortness of breath Dermatological: negative for rash Respiratory: negative for cough or wheezing Urologic: negative for hematuria Abdominal: negative for nausea, vomiting, diarrhea, bright red blood per rectum, melena, or hematemesis Neurologic: negative for visual changes, syncope, or dizziness All other systems reviewed and are otherwise negative except as noted above.    Blood pressure 102/68, pulse 86, height 6' 1 (1.854 m), weight 222 lb 6.4 oz (100.9 kg), SpO2 99%.  General appearance: alert and no distress Neck: no adenopathy, no carotid bruit, no JVD, supple, symmetrical, trachea midline, and thyroid not enlarged, symmetric, no tenderness/mass/nodules Lungs: clear to auscultation bilaterally Heart: regular rate and rhythm, S1, S2 normal, no murmur, click, rub or gallop Extremities: extremities normal, atraumatic, no cyanosis or edema Pulses: 2+ and symmetric Skin: Skin color, texture, turgor normal. No  rashes or lesions Neurologic: Grossly normal  EKG EKG Interpretation Date/Time:  Wednesday June 01 2024 14:21:54 EDT Ventricular Rate:  86 PR Interval:  172 QRS Duration:  92 QT Interval:  372 QTC Calculation: 445 R Axis:   84  Text Interpretation: Sinus rhythm with Premature atrial complexes Incomplete right bundle branch block When compared with ECG of 28-Jul-2019 23:12, PREVIOUS ECG IS PRESENT Confirmed by Court Carrier [phone]) on 06/01/2024 2:33:14  PM    ASSESSMENT AND PLAN:   Dyslipidemia History of dyslipidemia on rosuvastatin  with lipid profile performed 07/29/2023 revealing total cholesterol 141, LDL 76 and HDL 36.  CAD S/P percutaneous coronary angioplasty History of CAD status post RCA intervention by Dr. Swaziland in the setting of unstable angina 01/07/2018 with 2 Synergy drug-eluting stents.  The remainder of his coronary artery was free of disease.  He denies chest pain or shortness of breath.  Former smoker Ongoing tobacco abuse of 1/2 pack/day recalcitrant to risk factor modification.  Orthostasis Blood pressure today is 102/68.  He is not on any antihypertensive medications.  He is on Mounjaro.  We talked about getting up slowly.  I recommended compression stockings.     Carrier DOROTHA Court MD FACP,FACC,FAHA, [facility] 06/01/2024 2:43 PM

## 2024-06-01 NOTE — Assessment & Plan Note (Signed)
Ongoing tobacco abuse of 1/2 pack/day recalcitrant to risk factor modification.
